# Patient Record
Sex: Male | Born: 1970 | Race: White | Hispanic: No | Marital: Married | Smoking: Never smoker
Health system: Southern US, Community
[De-identification: ages and names within clinical notes are randomized; demographics above are authoritative.]

## PROBLEM LIST (undated history)

## (undated) DIAGNOSIS — K219 Gastro-esophageal reflux disease without esophagitis: Secondary | ICD-10-CM

## (undated) DIAGNOSIS — E7800 Pure hypercholesterolemia, unspecified: Secondary | ICD-10-CM

## (undated) DIAGNOSIS — E669 Obesity, unspecified: Secondary | ICD-10-CM

## (undated) HISTORY — DX: Obesity, unspecified: E66.9

## (undated) HISTORY — DX: Pure hypercholesterolemia, unspecified: E78.00

## (undated) HISTORY — DX: Gastro-esophageal reflux disease without esophagitis: K21.9

---

## 2004-08-13 ENCOUNTER — Ambulatory Visit: Payer: Self-pay | Admitting: Internal Medicine

## 2007-07-30 ENCOUNTER — Other Ambulatory Visit: Payer: Self-pay

## 2007-07-30 ENCOUNTER — Emergency Department: Payer: Self-pay

## 2007-10-12 ENCOUNTER — Ambulatory Visit: Payer: Self-pay | Admitting: Internal Medicine

## 2009-03-06 ENCOUNTER — Emergency Department: Payer: Self-pay | Admitting: Emergency Medicine

## 2009-03-25 ENCOUNTER — Ambulatory Visit: Payer: Self-pay | Admitting: Family

## 2011-01-31 ENCOUNTER — Ambulatory Visit: Payer: Self-pay | Admitting: Cardiovascular Disease

## 2011-02-08 ENCOUNTER — Ambulatory Visit (INDEPENDENT_AMBULATORY_CARE_PROVIDER_SITE_OTHER): Payer: 59 | Admitting: Cardiovascular Disease

## 2011-02-08 ENCOUNTER — Encounter: Payer: Self-pay | Admitting: Cardiovascular Disease

## 2011-02-08 ENCOUNTER — Emergency Department: Payer: Self-pay | Admitting: Emergency Medicine

## 2011-02-08 DIAGNOSIS — E669 Obesity, unspecified: Secondary | ICD-10-CM

## 2011-02-08 DIAGNOSIS — R0602 Shortness of breath: Secondary | ICD-10-CM

## 2011-02-08 DIAGNOSIS — R079 Chest pain, unspecified: Secondary | ICD-10-CM | POA: Insufficient documentation

## 2011-02-08 DIAGNOSIS — E785 Hyperlipidemia, unspecified: Secondary | ICD-10-CM

## 2011-02-08 NOTE — Assessment & Plan Note (Signed)
His triglycerides are very elevated. We have suggested he had flaxseed or fish oil to his diet in addition to fenofibrate. Weight loss will also help. He reports being on Lipitor previously. I suspect that with additional weight loss, his LDL will be at goal less than 100.

## 2011-02-08 NOTE — Assessment & Plan Note (Signed)
We have encouraged continued exercise, careful diet management in an effort to lose weight. 

## 2011-02-08 NOTE — Progress Notes (Signed)
   Patient ID: Victor Butler, male    DOB: 1971/06/05, 40 y.o.   MRN: [id number]  HPI Comments: 40 year old gentleman, patient of Dr. Darrick Huntsman, with history of diabetes who has had recent episodes of chest dullness over the past several months who presents by referral for further evaluation.  He reports that he has not had any episodes since April 26 when he last saw his premature physician. Prior to that, he had waxing waning episodes of chest dullness that would radiate through to his back. He felt that it was secondary to indigestion. Sometimes it would happen when he was sleeping, sometimes sitting on the couch. Recently he has begun working out at [employer] and has been at least 3 times and use a treadmill machine with no symptoms of chest pain or dullness or tightness. He has been trying to lose weight.  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  He denies ever smoking. He also reports that he has no significant family history apart from his grandfather who had heart issues in his 66s  EKG shows normal sinus rhythm with rate 56 beats per minute with no significant ST or T wave changes     Review of Systems  Constitutional: Negative.   HENT: Negative.   Eyes: Negative.   Respiratory: Negative.   Cardiovascular: Negative.        12 pressure in the chest at rest  Gastrointestinal: Negative.   Musculoskeletal: Negative.   Skin: Negative.   Neurological: Negative.   Hematological: Negative.   Psychiatric/Behavioral: Negative.   All other systems reviewed and are negative.    BP 124/74  Pulse 56  Ht 5\' 10"  (1.778 m)  Wt 275 lb 1.9 oz (124.794 kg)  BMI 39.48 kg/m2   Physical Exam  Nursing note and vitals reviewed. Constitutional: He is oriented to person, place, and time. He appears well-developed and well-nourished.       obese  HENT:  Head: Normocephalic.  Nose: Nose normal.  Mouth/Throat: Oropharynx is clear and moist.    Eyes: Conjunctivae are normal. Pupils are equal, round, and reactive to light.  Neck: Normal range of motion. Neck supple. No JVD present.  Cardiovascular: Normal rate, regular rhythm, S1 normal, S2 normal, normal heart sounds and intact distal pulses.  Exam reveals no gallop and no friction rub.   No murmur heard. Pulmonary/Chest: Effort normal and breath sounds normal. No respiratory distress. He has no wheezes. He has no rales. He exhibits no tenderness.  Abdominal: Soft. Bowel sounds are normal. He exhibits no distension. There is no tenderness.  Musculoskeletal: Normal range of motion. He exhibits no edema and no tenderness.  Lymphadenopathy:    He has no cervical adenopathy.  Neurological: He is alert and oriented to person, place, and time. Coordination normal.  Skin: Skin is warm and dry. No rash noted. No erythema.  Psychiatric: He has a normal mood and affect. His behavior is normal. Judgment and thought content normal.           Assessment and Plan

## 2011-02-08 NOTE — Assessment & Plan Note (Signed)
Chest dullness comes on at rest, he has not had symptoms for several weeks. He has been at the [facility] exercising and has not had symptoms. We have suggested that we watch him for now without further testing. I suggested if he has further episodes of chest discomfort, that he contact the clinic and we would set up a treadmill stress test.

## 2011-02-18 ENCOUNTER — Emergency Department: Payer: Self-pay | Admitting: Emergency Medicine

## 2011-05-27 ENCOUNTER — Telehealth: Payer: Self-pay | Admitting: Internal Medicine

## 2011-05-27 NOTE — Telephone Encounter (Signed)
Please let Victor Butler know that he does not have to go to [facility] anymore  For his labs.  He can have his labs done here anytime before his visit  (I am not sure if [name] told him that) On 06/14/01.  He needs a fasting liopid, CMET HgbA1c and urine microalb / cr ratio

## 2011-05-27 NOTE — Telephone Encounter (Signed)
PT NEEDS LAB ORDER FOR [facility].  HIS APPOINTMENT IS 06/15/11

## 2011-06-08 ENCOUNTER — Telehealth: Payer: Self-pay | Admitting: Internal Medicine

## 2011-06-08 NOTE — Telephone Encounter (Signed)
PT CALLED SAID FAX NUMBER FOR [facility] WAS [phone]

## 2011-06-09 ENCOUNTER — Other Ambulatory Visit: Payer: Self-pay | Admitting: Internal Medicine

## 2011-06-09 DIAGNOSIS — Z79899 Other long term (current) drug therapy: Secondary | ICD-10-CM

## 2011-06-09 DIAGNOSIS — E785 Hyperlipidemia, unspecified: Secondary | ICD-10-CM

## 2011-06-09 DIAGNOSIS — E119 Type 2 diabetes mellitus without complications: Secondary | ICD-10-CM

## 2011-06-09 NOTE — Telephone Encounter (Signed)
Patient called and stated he needs labs done today and he is fasting.  Please advise on what labs you want drwn he is going to [facility].

## 2011-06-15 ENCOUNTER — Encounter: Payer: Self-pay | Admitting: Internal Medicine

## 2011-06-15 ENCOUNTER — Ambulatory Visit (INDEPENDENT_AMBULATORY_CARE_PROVIDER_SITE_OTHER): Payer: 59 | Admitting: Internal Medicine

## 2011-06-15 DIAGNOSIS — E669 Obesity, unspecified: Secondary | ICD-10-CM

## 2011-06-15 DIAGNOSIS — E119 Type 2 diabetes mellitus without complications: Secondary | ICD-10-CM

## 2011-06-15 DIAGNOSIS — E785 Hyperlipidemia, unspecified: Secondary | ICD-10-CM

## 2011-06-15 DIAGNOSIS — H6690 Otitis media, unspecified, unspecified ear: Secondary | ICD-10-CM

## 2011-06-15 DIAGNOSIS — Z79899 Other long term (current) drug therapy: Secondary | ICD-10-CM

## 2011-06-15 MED ORDER — PHENYLEPHRINE HCL 10 MG PO TABS: 10.0000 mg | ORAL_TABLET | Freq: Four times a day (QID) | ORAL | Status: DC | PRN

## 2011-06-15 MED ORDER — AZITHROMYCIN 500 MG PO TABS: 500.0000 mg | ORAL_TABLET | Freq: Every day | ORAL | Status: AC

## 2011-06-15 MED ORDER — FENOFIBRATE 145 MG PO TABS: 145.0000 mg | ORAL_TABLET | Freq: Every day | ORAL | Status: DC

## 2011-06-15 NOTE — Patient Instructions (Addendum)
Please take azithromycin 1 tablet daily for 7 days .,and Sudafed PE OTC 10 mg every 6 hours for ear pain and congestion.  continue gemfibrozil until you receive fenofibrate via mail

## 2011-06-19 ENCOUNTER — Encounter: Payer: Self-pay | Admitting: Internal Medicine

## 2011-06-19 DIAGNOSIS — E781 Pure hyperglyceridemia: Secondary | ICD-10-CM | POA: Insufficient documentation

## 2011-06-19 DIAGNOSIS — Z79899 Other long term (current) drug therapy: Secondary | ICD-10-CM | POA: Insufficient documentation

## 2011-06-19 DIAGNOSIS — H6690 Otitis media, unspecified, unspecified ear: Secondary | ICD-10-CM | POA: Insufficient documentation

## 2011-06-19 DIAGNOSIS — E7800 Pure hypercholesterolemia, unspecified: Secondary | ICD-10-CM | POA: Insufficient documentation

## 2011-06-19 DIAGNOSIS — K219 Gastro-esophageal reflux disease without esophagitis: Secondary | ICD-10-CM | POA: Insufficient documentation

## 2011-06-19 DIAGNOSIS — E669 Obesity, unspecified: Secondary | ICD-10-CM | POA: Insufficient documentation

## 2011-06-19 DIAGNOSIS — E119 Type 2 diabetes mellitus without complications: Secondary | ICD-10-CM | POA: Insufficient documentation

## 2011-06-19 NOTE — Progress Notes (Signed)
Subjective:    Patient ID: Victor Butler, male    DOB: [birth year], 40 y.o.   MRN: [id number]  HPI  40 yo white male with history of hypertriglyceridemia, diabetes mellitus and and obesity presents for follow up.  Has been taking gemfibrozil for management of triglycerides without side effects. Repeat labs recently were improved but not at goal and were reportedly better with Tricor.  He has had some weight gain,  Is not exercising regularly due to a head cold and has been waking up with frontal headache .   Past Medical History  Diagnosis Date  . Diabetes mellitus   . GERD (gastroesophageal reflux disease)   . Hypercholesteremia   . Obesity (BMI 30-39.9)    Current Outpatient Prescriptions on File Prior to Visit  Medication Sig Dispense Refill  . aspirin 81 MG EC tablet Take 81 mg by mouth daily.        . Cholecalciferol (VITAMIN D3) 400 UNITS CAPS Take 1 capsule by mouth daily.       Marland Kitchen glipiZIDE (GLUCOTROL) 10 MG tablet Take 10 mg by mouth daily.       Marland Kitchen lisinopril (PRINIVIL,ZESTRIL) 5 MG tablet Take 5 mg by mouth daily.        Marland Kitchen loratadine (CLARITIN) 10 MG tablet Take 10 mg by mouth daily.        Marland Kitchen omeprazole (PRILOSEC) 20 MG capsule Take 20 mg by mouth daily as needed.           Review of Systems  Constitutional: Negative for fever, chills, diaphoresis, activity change, appetite change, fatigue and unexpected weight change.  HENT: Negative for hearing loss, ear pain, nosebleeds, congestion, sore throat, facial swelling, rhinorrhea, sneezing, drooling, mouth sores, trouble swallowing, neck pain, neck stiffness, dental problem, voice change, postnasal drip, sinus pressure, tinnitus and ear discharge.   Eyes: Negative for photophobia, pain, discharge, redness, itching and visual disturbance.  Respiratory: Negative for apnea, cough, choking, chest tightness, shortness of breath, wheezing and stridor.   Cardiovascular: Negative for chest pain, palpitations and leg swelling.    Gastrointestinal: Negative for nausea, vomiting, abdominal pain, diarrhea, constipation, blood in stool, abdominal distention, anal bleeding and rectal pain.  Genitourinary: Negative for dysuria, urgency, frequency, hematuria, flank pain, decreased urine volume, scrotal swelling, difficulty urinating and testicular pain.  Musculoskeletal: Negative for myalgias, back pain, joint swelling, arthralgias and gait problem.  Skin: Negative for color change, rash and wound.  Neurological: Negative for dizziness, tremors, seizures, syncope, speech difficulty, weakness, light-headedness, numbness and headaches.  Psychiatric/Behavioral: Negative for suicidal ideas, hallucinations, behavioral problems, confusion, sleep disturbance, dysphoric mood, decreased concentration and agitation. The patient is not nervous/anxious.    BP 112/76  Pulse 78  Temp(Src) 97.8 F (36.6 C) (Oral)  Resp 16  Ht 5\' 10"  (1.778 m)  Wt 275 lb 8 oz (124.966 kg)  BMI 39.53 kg/m2  SpO2 95%     Objective:   Physical Exam  Constitutional: He is oriented to person, place, and time.  HENT:  Head: Normocephalic and atraumatic.  Mouth/Throat: Oropharynx is clear and moist.  Eyes: Conjunctivae and EOM are normal.  Neck: Normal range of motion. Neck supple. No JVD present. No thyromegaly present.  Cardiovascular: Normal rate, regular rhythm and normal heart sounds.   Pulmonary/Chest: Effort normal and breath sounds normal. He has no wheezes. He has no rales.  Abdominal: Soft. Bowel sounds are normal. He exhibits no mass. There is no tenderness. There is no rebound.  Musculoskeletal: Normal range of motion.  He exhibits no edema.  Neurological: He is alert and oriented to person, place, and time.  Skin: Skin is warm and dry.  Psychiatric: He has a normal mood and affect.       Assessment & Plan:

## 2011-06-19 NOTE — Assessment & Plan Note (Signed)
He has symptoms of ear fulness, headache and recent URI.  Exam suggestive of OM.  Will treat with azithromycin and oral decongestants for one week.

## 2011-06-19 NOTE — Assessment & Plan Note (Signed)
Well controlled on oral antihyperglycemics.  Recent A1c is < 7.0.  NO changes today.

## 2011-06-19 NOTE — Assessment & Plan Note (Signed)
His triglycerides are again elevated after switching to gemfibrozil but his LD has improved.  Will resume fenofibrate.  Again recommended low carbohydrate diet and exercise to improve his overall metabolic syndrome.

## 2011-07-01 ENCOUNTER — Encounter: Payer: Self-pay | Admitting: Internal Medicine

## 2011-07-08 ENCOUNTER — Other Ambulatory Visit: Payer: Self-pay | Admitting: Internal Medicine

## 2011-07-08 MED ORDER — FENOFIBRATE 145 MG PO TABS: 145.0000 mg | ORAL_TABLET | Freq: Every day | ORAL | Status: DC

## 2011-08-09 ENCOUNTER — Other Ambulatory Visit: Payer: Self-pay | Admitting: Internal Medicine

## 2011-08-10 ENCOUNTER — Encounter: Payer: Self-pay | Admitting: Internal Medicine

## 2011-08-10 ENCOUNTER — Ambulatory Visit (INDEPENDENT_AMBULATORY_CARE_PROVIDER_SITE_OTHER): Payer: 59 | Admitting: Internal Medicine

## 2011-08-10 VITALS — BP 118/72 | HR 98 | Temp 98.4°F | Wt 277.0 lb

## 2011-08-10 DIAGNOSIS — J0100 Acute maxillary sinusitis, unspecified: Secondary | ICD-10-CM

## 2011-08-10 MED ORDER — LEVOFLOXACIN 750 MG PO TABS: 750.0000 mg | ORAL_TABLET | Freq: Every day | ORAL | Status: AC

## 2011-08-10 NOTE — Progress Notes (Signed)
Subjective:    Patient ID: Victor Butler, male    DOB: 1971-09-03, 40 y.o.   MRN: [id number]  Sinusitis This is a recurrent problem. The current episode started 1 to 4 weeks ago. The problem has been gradually worsening since onset. There has been no fever. The pain is moderate. Associated symptoms include congestion, headaches, neck pain and sinus pressure. Pertinent negatives include no chills, coughing, ear pain, shortness of breath or sneezing. Past treatments include acetaminophen, oral decongestants, saline sprays and spray decongestants. The treatment provided no relief.     Outpatient Encounter Prescriptions as of 08/10/2011  Medication Sig Dispense Refill  . aspirin 81 MG EC tablet Take 81 mg by mouth daily.        . Cholecalciferol (VITAMIN D3) 400 UNITS CAPS Take 1 capsule by mouth daily.       Marland Kitchen gemfibrozil (LOPID) 600 MG tablet Take 600 mg by mouth daily.        Marland Kitchen glipiZIDE (GLUCOTROL) 10 MG tablet Take 10 mg by mouth daily.       Marland Kitchen lisinopril (PRINIVIL,ZESTRIL) 5 MG tablet Take 5 mg by mouth daily.        Marland Kitchen loratadine (CLARITIN) 10 MG tablet Take 10 mg by mouth daily.        . metformin (FORTAMET) 500 MG (OSM) 24 hr tablet Take 500 mg by mouth daily with breakfast.        . omeprazole (PRILOSEC) 20 MG capsule Take 20 mg by mouth daily as needed.       . phenylephrine (SUDAFED PE) 10 MG TABS Take 1 tablet (10 mg total) by mouth every 6 (six) hours as needed (congestion ).  30 tablet  1  . fenofibrate (TRICOR) 145 MG tablet Take 1 tablet (145 mg total) by mouth daily. Refill as generic medication only.  90 tablet  3    Review of Systems  Constitutional: Negative for fever, chills, activity change, appetite change, fatigue and unexpected weight change.  HENT: Positive for congestion, neck pain, postnasal drip and sinus pressure. Negative for hearing loss, ear pain, nosebleeds, rhinorrhea, sneezing, neck stiffness, voice change, tinnitus and ear discharge.   Eyes: Negative for  photophobia, discharge, redness, itching and visual disturbance.  Respiratory: Negative for cough, chest tightness, shortness of breath and wheezing.   Cardiovascular: Negative for chest pain, palpitations and leg swelling.  Gastrointestinal: Negative for abdominal pain.  Musculoskeletal: Negative for myalgias.  Skin: Negative for color change and rash.  Neurological: Positive for headaches. Negative for dizziness and facial asymmetry.   BP 118/72  Pulse 98  Temp(Src) 98.4 F (36.9 C) (Oral)  Wt 277 lb (125.646 kg)  SpO2 95%     Objective:   Physical Exam  Constitutional: He is oriented to person, place, and time. He appears well-developed and well-nourished. No distress.  HENT:  Head: Normocephalic and atraumatic.  Right Ear: External ear normal. Tympanic membrane is erythematous. A middle ear effusion is present.  Left Ear: External ear normal. Tympanic membrane is erythematous. A middle ear effusion is present.  Nose: Nose normal.  Mouth/Throat: Oropharynx is clear and moist. No oropharyngeal exudate.  Eyes: Conjunctivae and EOM are normal. Pupils are equal, round, and reactive to light. Right eye exhibits no discharge. Left eye exhibits no discharge. No scleral icterus.  Neck: Normal range of motion. Neck supple. No tracheal deviation present. No thyromegaly present.  Cardiovascular: Normal rate, regular rhythm and normal heart sounds.  Exam reveals no gallop and no friction rub.  No murmur heard. Pulmonary/Chest: Effort normal and breath sounds normal. No respiratory distress. He has no wheezes. He has no rales. He exhibits no tenderness.  Musculoskeletal: Normal range of motion. He exhibits no edema.  Lymphadenopathy:    He has no cervical adenopathy.  Neurological: He is alert and oriented to person, place, and time. No cranial nerve deficit. Coordination normal.  Skin: Skin is warm and dry. No rash noted. He is not diaphoretic. No erythema. No pallor.  Psychiatric: He has  a normal mood and affect. His behavior is normal. Judgment and thought content normal.          Assessment & Plan:  1. Left Maxillary Sinusitis - Will treat with Levaquin. Continue sudafed, ibuprofen as needed. Pt will call if symptoms not improving next 48hr.

## 2011-10-09 ENCOUNTER — Telehealth: Payer: Self-pay | Admitting: Internal Medicine

## 2011-10-09 NOTE — Telephone Encounter (Signed)
His triglycerides are 503, fasting glucose was 205,  hgba1c was 7.8 and there was protein in his urine.  We will address all of these with his next visit

## 2011-10-10 NOTE — Telephone Encounter (Signed)
Left message asking patient to return my call.

## 2011-10-11 ENCOUNTER — Ambulatory Visit (INDEPENDENT_AMBULATORY_CARE_PROVIDER_SITE_OTHER): Payer: 59 | Admitting: Internal Medicine

## 2011-10-11 ENCOUNTER — Encounter: Payer: Self-pay | Admitting: Internal Medicine

## 2011-10-11 DIAGNOSIS — E785 Hyperlipidemia, unspecified: Secondary | ICD-10-CM

## 2011-10-11 DIAGNOSIS — E669 Obesity, unspecified: Secondary | ICD-10-CM

## 2011-10-11 DIAGNOSIS — E119 Type 2 diabetes mellitus without complications: Secondary | ICD-10-CM

## 2011-10-11 DIAGNOSIS — E1129 Type 2 diabetes mellitus with other diabetic kidney complication: Secondary | ICD-10-CM

## 2011-10-11 MED ORDER — METFORMIN HCL ER (OSM) 500 MG PO TB24: 500.0000 mg | ORAL_TABLET | Freq: Every day | ORAL | Status: DC

## 2011-10-11 MED ORDER — ALPRAZOLAM 0.5 MG PO TBDP: 0.5000 mg | ORAL_TABLET | Freq: Every day | ORAL | Status: AC | PRN

## 2011-10-11 NOTE — Patient Instructions (Signed)
You need to exercise for a minimum of 30 minutes daily 5 days per week.  You need to be breathing hard  To get the aerobic benefit.   We are going to try alprazolam for the anxiety   i want you to lose 12 lbs by the next visit.   Use the EAS Carb Control protein shake for breakfast followed by an Atkins chocolate lover's variety protein bar mid morning,

## 2011-10-11 NOTE — Progress Notes (Signed)
Subjective:    Patient ID: Victor Butler, male    DOB: 12-19-1970, 41 y.o.   MRN: [id number]  HPI  Victor Butler IS A 41 yr old white male with  DM , hyperlipidemia, HTN and obesity who presents for 3 month followup with a  multitude of complaints.  He continues to have episodes of chest pain which occur while sitting at his desk. He had a cardiology evla after last visit for same complaint and had a normal ECHO so no myoview was done by Dr. Mariah Milling since hee never has the chest pain with treadmill use even after 2 miles.  He describes it is a dull ache.  His second complaint is chronic sinus congestion, occasional tooth pain, with priro normal ENT evaluation including  A normal sinus CT.  His third complaint is increased stress and anxiety caused by recent move to a new house that has been  Requiring additional financial investments.  He is not exercising daily or evern 3 times a week despite having a gym membership and ample space in his new home o use equip,ment he already owns.       Review of Systems  Constitutional: Positive for unexpected weight change. Negative for fever, chills, diaphoresis, activity change, appetite change and fatigue.  HENT: Positive for congestion, rhinorrhea and postnasal drip. Negative for hearing loss, ear pain, nosebleeds, sore throat, facial swelling, sneezing, drooling, mouth sores, trouble swallowing, neck pain, neck stiffness, dental problem, voice change, sinus pressure, tinnitus and ear discharge.   Eyes: Negative for photophobia, pain, discharge, redness, itching and visual disturbance.  Respiratory: Negative for apnea, cough, choking, chest tightness, shortness of breath, wheezing and stridor.   Cardiovascular: Negative for chest pain, palpitations and leg swelling.  Gastrointestinal: Negative for nausea, vomiting, abdominal pain, diarrhea, constipation, blood in stool, abdominal distention, anal bleeding and rectal pain.  Genitourinary: Negative for dysuria,  urgency, frequency, hematuria, flank pain, decreased urine volume, scrotal swelling, difficulty urinating and testicular pain.  Musculoskeletal: Negative for myalgias, back pain, joint swelling, arthralgias and gait problem.  Skin: Negative for color change, rash and wound.  Neurological: Negative for dizziness, tremors, seizures, syncope, speech difficulty, weakness, light-headedness, numbness and headaches.  Psychiatric/Behavioral: Negative for suicidal ideas, hallucinations, behavioral problems, confusion, sleep disturbance, dysphoric mood, decreased concentration and agitation. The patient is nervous/anxious.   BP 108/84  Pulse 78  Temp(Src) 98.2 F (36.8 C) (Oral)  Wt 276 lb (125.193 kg)  SpO2 97%    Objective:   Physical Exam  Constitutional: He is oriented to person, place, and time.  HENT:  Head: Normocephalic and atraumatic.  Mouth/Throat: Oropharynx is clear and moist.  Eyes: Conjunctivae and EOM are normal.  Neck: Normal range of motion. Neck supple. No JVD present. No thyromegaly present.  Cardiovascular: Normal rate, regular rhythm and normal heart sounds.   Pulmonary/Chest: Effort normal and breath sounds normal. He has no wheezes. He has no rales.  Abdominal: Soft. Bowel sounds are normal. He exhibits no mass. There is no tenderness. There is no rebound.  Musculoskeletal: Normal range of motion. He exhibits no edema.  Neurological: He is alert and oriented to person, place, and time.  Skin: Skin is warm and dry.  Psychiatric: He has a normal mood and affect.      Assessment & Plan:   Obesity (BMI 30-39.9) I have strongly recommended concerted effort to reduce weight through starch restricted diet and aerobic exercise 5 days per week.  Goal is 4 lbs/month.   Other and  unspecified hyperlipidemia His triglcerides are 500 despite use of fenofibrate. Strongly recommended compliance with medication andrestricting starches to one or 2 servings daily and exercise.  Repeat in  3 months   Type II or unspecified type diabetes mellitus with renal manifestations, not stated as uncontrolled With slight loss of control noted,  Recommended diet and exercise and repeat labs in 3 months at which time medication will be added if not at goal of 7.0 (currently 7.8) with proteinuria noted on UA>      Updated Medication List Outpatient Encounter Prescriptions as of 10/11/2011  Medication Sig Dispense Refill  . aspirin 81 MG EC tablet Take 81 mg by mouth daily.        . Cholecalciferol (VITAMIN D3) 400 UNITS CAPS Take 1 capsule by mouth daily.       . fenofibrate (TRICOR) 145 MG tablet Take 1 tablet (145 mg total) by mouth daily. Refill as generic medication only.  90 tablet  3  . glipiZIDE (GLUCOTROL) 10 MG tablet Take 10 mg by mouth daily.       Marland Kitchen lisinopril (PRINIVIL,ZESTRIL) 5 MG tablet TAKE 1 TABLET BY MOUTH DAILY  90 tablet  0  . loratadine (CLARITIN) 10 MG tablet Take 10 mg by mouth daily.        . metformin (FORTAMET) 500 MG (OSM) 24 hr tablet Take 1 tablet (500 mg total) by mouth daily with breakfast.  90 tablet  2  . omeprazole (PRILOSEC) 20 MG capsule Take 20 mg by mouth daily as needed.       . phenylephrine (SUDAFED PE) 10 MG TABS Take 1 tablet (10 mg total) by mouth every 6 (six) hours as needed (congestion ).  30 tablet  1  . DISCONTD: metformin (FORTAMET) 500 MG (OSM) 24 hr tablet Take 500 mg by mouth daily with breakfast.        . DISCONTD: metformin (FORTAMET) 500 MG (OSM) 24 hr tablet Take 1 tablet (500 mg total) by mouth daily with breakfast.  30 tablet  1  . ALPRAZolam (NIRAVAM) 0.5 MG dissolvable tablet Take 1 tablet (0.5 mg total) by mouth daily as needed for anxiety.  30 tablet  1  . DISCONTD: gemfibrozil (LOPID) 600 MG tablet Take 600 mg by mouth daily.

## 2011-10-12 ENCOUNTER — Encounter: Payer: Self-pay | Admitting: Internal Medicine

## 2011-10-12 DIAGNOSIS — E1165 Type 2 diabetes mellitus with hyperglycemia: Secondary | ICD-10-CM | POA: Insufficient documentation

## 2011-10-12 DIAGNOSIS — IMO0002 Reserved for concepts with insufficient information to code with codable children: Secondary | ICD-10-CM | POA: Insufficient documentation

## 2011-10-12 NOTE — Assessment & Plan Note (Signed)
With slight loss of control noted,  Recommended diet and exercise and repeat labs in 3 months at which time medication will be added if not at goal of 7.0 (currently 7.8) with proteinuria noted on UA>

## 2011-10-12 NOTE — Assessment & Plan Note (Deleted)
Loss of control noted,  Recommended diet and exercise and repeat labs in 3 months at which time medication will be added if not at goal of 7.0

## 2011-10-12 NOTE — Assessment & Plan Note (Signed)
His triglcerides are 500 despite use of fenofibrate. Strongly recommended compliance with medication andrestricting starches to one or 2 servings daily and exercise.  Repeat in 3 months

## 2011-10-12 NOTE — Assessment & Plan Note (Signed)
I have strongly recommended concerted effort to reduce weight through starch restricted diet and aerobic exercise 5 days per week.  Goal is 4 lbs/month.

## 2011-11-01 ENCOUNTER — Other Ambulatory Visit: Payer: Self-pay | Admitting: *Deleted

## 2011-11-01 MED ORDER — METFORMIN HCL 500 MG PO TABS: 500.0000 mg | ORAL_TABLET | Freq: Two times a day (BID) | ORAL | Status: DC

## 2011-11-10 ENCOUNTER — Telehealth: Payer: Self-pay | Admitting: Internal Medicine

## 2011-11-10 MED ORDER — GLUCOSE BLOOD VI STRP: ORAL_STRIP | Status: DC

## 2011-11-10 NOTE — Telephone Encounter (Signed)
Rx has been done. 

## 2011-11-14 ENCOUNTER — Other Ambulatory Visit: Payer: Self-pay | Admitting: *Deleted

## 2011-11-14 MED ORDER — LISINOPRIL 5 MG PO TABS: ORAL_TABLET | ORAL | Status: DC

## 2011-11-21 ENCOUNTER — Telehealth: Payer: Self-pay | Admitting: Internal Medicine

## 2011-11-21 NOTE — Telephone Encounter (Signed)
[facility] Triage Call Report Triage Record Num: [id number] Operator: Fernand Parkins Patient Name: Victor Butler Call Date & Time: 11/21/2011 10:03:40AM Patient Phone: [phone] PCP: Duncan Dull Patient Gender: Male PCP Fax : [phone] Patient DOB: 1971/05/04 Practice Name: [facility] Reason for Call: Caller: Jahvon/Patient; PCP: Duncan Dull; CB#: [phone]; ; ; Call regarding Cold Sx, Chest Tightness and Breathing Difficulty THE PATIENT REFUSED 911; Afebrile. Earnie/pt calling regarding a dull ache in chest and back area with shortness of breath - onset 11/17/2011. Chest discomfort and shortness of breath are currently resolved. Pt feels like it's r/t the chest cold that he currently has. S/w Robin in pcp office, appt 11/22/2011 @ 8:15am with MD Duncan Dull. All emergent sxs r/o with exception of see provider within 24 hrs - per Chest Pain guidelines. Care advice given. Protocol(s) Used: Chest Pain Recommended Outcome per Protocol: See Provider within 24 hours Reason for Outcome: One or more occurrences of unexplained pain in shoulders, neck, jaw, in one or both arms, stomach or back lasting more than a few minutes that has not been evaluated by a healthcare provider and has risk factors for cardiovascular disease. Care Advice: ~ Call provider if symptoms worsen or new symptoms develop. Consult with your healthcare provider to identify your risks and follow recommendations to reduce any risks identified. ~ ~ IMMEDIATE ACTION ~ CAUTIONS ~ List, or take, all current prescription(s), nonprescription or alternative medication(s) to provider for evaluation. CALL EMS 911 if chest pain/discomfort lasts five minutes or more; may or may not also have pain/discomfort in one or both arms, the back, neck, jaw or stomach and/or shortness of breath, sweating, nausea or lightheadedness. ~ "Know your numbers" to help manage risk for cardiovascular disease.  Regularly monitor your blood pressure, cholesterol, blood glucose and body mass index and take steps to reduce your risks. ~ 11/21/2011 10:25:37AM Page 1 of 1 [email or web address]

## 2011-11-22 ENCOUNTER — Ambulatory Visit (INDEPENDENT_AMBULATORY_CARE_PROVIDER_SITE_OTHER): Payer: 59 | Admitting: Internal Medicine

## 2011-11-22 ENCOUNTER — Encounter: Payer: Self-pay | Admitting: Internal Medicine

## 2011-11-22 ENCOUNTER — Ambulatory Visit: Payer: Self-pay | Admitting: Internal Medicine

## 2011-11-22 VITALS — BP 100/70 | HR 92 | Temp 98.6°F | Wt 260.0 lb

## 2011-11-22 DIAGNOSIS — R079 Chest pain, unspecified: Secondary | ICD-10-CM

## 2011-11-22 DIAGNOSIS — E1129 Type 2 diabetes mellitus with other diabetic kidney complication: Secondary | ICD-10-CM

## 2011-11-22 DIAGNOSIS — N058 Unspecified nephritic syndrome with other morphologic changes: Secondary | ICD-10-CM

## 2011-11-22 LAB — COMPREHENSIVE METABOLIC PANEL
ALT: 29 U/L (ref 0–53)
AST: 29 U/L (ref 0–37)
Albumin: 4.4 g/dL (ref 3.5–5.2)
Alkaline Phosphatase: 63 U/L (ref 39–117)
Glucose, Bld: 122 mg/dL — ABNORMAL HIGH (ref 70–99)
Potassium: 4.6 mEq/L (ref 3.5–5.1)
Sodium: 142 mEq/L (ref 135–145)
Total Bilirubin: 0.3 mg/dL (ref 0.3–1.2)
Total Protein: 7.4 g/dL (ref 6.0–8.3)

## 2011-11-22 LAB — LIPASE: Lipase: 27 U/L (ref 11.0–59.0)

## 2011-11-22 NOTE — Telephone Encounter (Signed)
Pt was seen in office this morning

## 2011-11-22 NOTE — Progress Notes (Signed)
Subjective:    Patient ID: Victor Butler, male    DOB: 09-Dec-1970, 41 y.o.   MRN: [id number]  HPI  Victor Butler is a 41 yr old white male with history of Type 2 DM who presents with a dull ache in chest which started Friday morning (5 days ago)  The pain is located more between shoulder blades than in the anterior chest.  Comes and goes,  More obvious at times than others,  Dull /soreness in quality.  No pain with exercise on the treadmill.  Denies nausea ,  Has some shortness of breath at rest, feels like he needs to take a deep  Breath.  No history of cough or wheezing .  Has lost 16 lbs in the past month since starting the Atkins diet and has been having hypoglycemic episodes on metformin and glipizide.    Past Medical History  Diagnosis Date  . Diabetes mellitus   . GERD (gastroesophageal reflux disease)   . Hypercholesteremia   . Obesity (BMI 30-39.9)    Current Outpatient Prescriptions on File Prior to Visit  Medication Sig Dispense Refill  . aspirin 81 MG EC tablet Take 81 mg by mouth daily.        . Cholecalciferol (VITAMIN D3) 400 UNITS CAPS Take 1 capsule by mouth daily.       . fenofibrate (TRICOR) 145 MG tablet Take 1 tablet (145 mg total) by mouth daily. Refill as generic medication only.  90 tablet  3  . glucose blood (CHOICE DM FORA G20 TEST STRIPS) test strip Use as instructed  300 each  1  . lisinopril (PRINIVIL,ZESTRIL) 5 MG tablet Take one by mouth daily.  90 tablet  3  . loratadine (CLARITIN) 10 MG tablet Take 10 mg by mouth daily.        . metFORMIN (GLUCOPHAGE) 500 MG tablet Take 1 tablet (500 mg total) by mouth 2 (two) times daily with a meal.  180 tablet  3  . omeprazole (PRILOSEC) 20 MG capsule Take 20 mg by mouth daily as needed.       . phenylephrine (SUDAFED PE) 10 MG TABS Take 1 tablet (10 mg total) by mouth every 6 (six) hours as needed (congestion ).  30 tablet  1   Review of Systems  Constitutional: Negative for fever, chills, diaphoresis, activity change,  appetite change, fatigue and unexpected weight change.  HENT: Negative for hearing loss, ear pain, nosebleeds, congestion, sore throat, facial swelling, rhinorrhea, sneezing, drooling, mouth sores, trouble swallowing, neck pain, neck stiffness, dental problem, voice change, postnasal drip, sinus pressure, tinnitus and ear discharge.   Eyes: Negative for photophobia, pain, discharge, redness, itching and visual disturbance.  Respiratory: Positive for chest tightness. Negative for apnea, cough, choking, shortness of breath, wheezing and stridor.   Cardiovascular: Negative for chest pain, palpitations and leg swelling.  Gastrointestinal: Negative for nausea, vomiting, abdominal pain, diarrhea, constipation, blood in stool, abdominal distention, anal bleeding and rectal pain.  Genitourinary: Negative for dysuria, urgency, frequency, hematuria, flank pain, decreased urine volume, scrotal swelling, difficulty urinating and testicular pain.  Musculoskeletal: Positive for back pain. Negative for myalgias, joint swelling, arthralgias and gait problem.  Skin: Negative for color change, rash and wound.  Neurological: Negative for dizziness, tremors, seizures, syncope, speech difficulty, weakness, light-headedness, numbness and headaches.  Psychiatric/Behavioral: Negative for suicidal ideas, hallucinations, behavioral problems, confusion, sleep disturbance, dysphoric mood, decreased concentration and agitation. The patient is not nervous/anxious.        Objective:  Physical Exam  Constitutional: He is oriented to person, place, and time.  HENT:  Head: Normocephalic and atraumatic.  Mouth/Throat: Oropharynx is clear and moist.  Eyes: Conjunctivae and EOM are normal.  Neck: Normal range of motion. Neck supple. No JVD present. No thyromegaly present.  Cardiovascular: Normal rate, regular rhythm and normal heart sounds.   Pulmonary/Chest: Effort normal and breath sounds normal. He has no wheezes. He has no  rales.  Abdominal: Soft. Bowel sounds are normal. He exhibits no mass. There is no tenderness. There is no rebound.  Musculoskeletal: Normal range of motion. He exhibits no edema.  Neurological: He is alert and oriented to person, place, and time.  Skin: Skin is warm and dry.  Psychiatric: He has a normal mood and affect.      Assessment & Plan:   Chest pain ddx includes GB disease, pancreatic disease,  atypical angina and muscle strain.   EKG, hepatic enzymes and lipase are normal, and cardiac enzymes and CXR are pending .    Type II or unspecified type diabetes mellitus with renal manifestations, not stated as uncontrolled He has lost 15 lbs on the Atkins diet and has had recurrent hypoglycemia.  We are stopping his glipizide.     Updated Medication List Outpatient Encounter Prescriptions as of 11/22/2011  Medication Sig Dispense Refill  . aspirin 81 MG EC tablet Take 81 mg by mouth daily.        . Cholecalciferol (VITAMIN D3) 400 UNITS CAPS Take 1 capsule by mouth daily.       . fenofibrate (TRICOR) 145 MG tablet Take 1 tablet (145 mg total) by mouth daily. Refill as generic medication only.  90 tablet  3  . glucose blood (CHOICE DM FORA G20 TEST STRIPS) test strip Use as instructed  300 each  1  . lisinopril (PRINIVIL,ZESTRIL) 5 MG tablet Take one by mouth daily.  90 tablet  3  . loratadine (CLARITIN) 10 MG tablet Take 10 mg by mouth daily.        . metFORMIN (GLUCOPHAGE) 500 MG tablet Take 1 tablet (500 mg total) by mouth 2 (two) times daily with a meal.  180 tablet  3  . omeprazole (PRILOSEC) 20 MG capsule Take 20 mg by mouth daily as needed.       . phenylephrine (SUDAFED PE) 10 MG TABS Take 1 tablet (10 mg total) by mouth every 6 (six) hours as needed (congestion ).  30 tablet  1  . DISCONTD: glipiZIDE (GLUCOTROL) 10 MG tablet Take 10 mg by mouth daily.

## 2011-11-22 NOTE — Assessment & Plan Note (Addendum)
ddx includes GB disease, pancreatic disease,  atypical angina and muscle strain.   EKG, hepatic enzymes and lipase are normal, and cardiac enzymes and CXR are pending .

## 2011-11-22 NOTE — Assessment & Plan Note (Signed)
He has lost 15 lbs on the Atkins diet and has had recurrent hypoglycemia.  We are stopping his glipizide.

## 2011-11-23 ENCOUNTER — Telehealth: Payer: Self-pay | Admitting: Internal Medicine

## 2011-11-23 LAB — CK TOTAL AND CKMB (NOT AT ARMC)
CK, MB: 2.9 ng/mL (ref 0.3–4.0)
Relative Index: 1.6 (ref 0.0–2.5)

## 2011-11-23 NOTE — Telephone Encounter (Signed)
His chest x ray was normal.  The etiology of  his recent chest/back discomfort is still unclear.  If it persists or changes in some way, I am happy to  continue workup with either an abdominal ultrasound or CT scan  To image the GB and pancreas which can both cause "referred" pain to the shoulder blades.

## 2011-11-24 NOTE — Telephone Encounter (Signed)
Left message asking patient to call back

## 2011-12-01 ENCOUNTER — Encounter: Payer: Self-pay | Admitting: Internal Medicine

## 2011-12-01 NOTE — Telephone Encounter (Signed)
Patient notified. He says that he is not having any pain at all right now and hasn't for several days. He was advised to call back if pain come back.

## 2011-12-27 ENCOUNTER — Telehealth: Payer: Self-pay | Admitting: Internal Medicine

## 2011-12-27 DIAGNOSIS — E785 Hyperlipidemia, unspecified: Secondary | ICD-10-CM

## 2011-12-27 NOTE — Telephone Encounter (Signed)
Labs order placed as future with [facility] as resulting agency.  Do we need to print to send to [facility]?

## 2011-12-27 NOTE — Telephone Encounter (Signed)
Patient called and needs a lab order for his 3 month check up next week sent to [facility].  He also stated his work needs a urine micro/albumin.  Please advise.  Fax: [phone]

## 2011-12-27 NOTE — Telephone Encounter (Signed)
Yes, I will fax them to number provided.   Orders faxed.

## 2012-01-04 ENCOUNTER — Telehealth: Payer: Self-pay | Admitting: Internal Medicine

## 2012-01-04 NOTE — Telephone Encounter (Signed)
He has brought his triglycerides down from over 500 to 181 and his hgba1c from 7.8 to 6.7 with diet and exercise. And there is no protein in hir urine. This is excellent! Keep doing what he is doing.

## 2012-01-05 NOTE — Telephone Encounter (Signed)
Patient notified of results.

## 2012-01-09 ENCOUNTER — Encounter: Payer: Self-pay | Admitting: Internal Medicine

## 2012-01-09 ENCOUNTER — Ambulatory Visit (INDEPENDENT_AMBULATORY_CARE_PROVIDER_SITE_OTHER): Payer: 59 | Admitting: Internal Medicine

## 2012-01-09 ENCOUNTER — Telehealth: Payer: Self-pay | Admitting: Internal Medicine

## 2012-01-09 DIAGNOSIS — E291 Testicular hypofunction: Secondary | ICD-10-CM

## 2012-01-09 DIAGNOSIS — N529 Male erectile dysfunction, unspecified: Secondary | ICD-10-CM

## 2012-01-09 DIAGNOSIS — E1129 Type 2 diabetes mellitus with other diabetic kidney complication: Secondary | ICD-10-CM

## 2012-01-09 DIAGNOSIS — E669 Obesity, unspecified: Secondary | ICD-10-CM

## 2012-01-09 DIAGNOSIS — N058 Unspecified nephritic syndrome with other morphologic changes: Secondary | ICD-10-CM

## 2012-01-09 DIAGNOSIS — E785 Hyperlipidemia, unspecified: Secondary | ICD-10-CM

## 2012-01-09 MED ORDER — SILDENAFIL CITRATE 50 MG PO TABS: 50.0000 mg | ORAL_TABLET | Freq: Every day | ORAL | Status: DC | PRN

## 2012-01-09 MED ORDER — ACCU-CHEK MULTICLIX LANCETS MISC: Status: AC

## 2012-01-09 NOTE — Telephone Encounter (Signed)
Victor Butler,  Can you order the following labs for Victor Butler via [facility]?   Testosterone, free and total  To be done ASAP  FOLLOWED BY:   Occurring in 3 months :  Hgba1c, CMEt, fasting lpids, adn  Urine microalb/Cr ratio   Thank you

## 2012-01-09 NOTE — Progress Notes (Signed)
Patient ID: Victor Butler, male   DOB: 1971-06-12, 41 y.o.   MRN: [id number]  Patient Active Problem List  Diagnoses  . Chest pain  . GERD (gastroesophageal reflux disease)  . Obesity (BMI 30-39.9)  . Other and unspecified hyperlipidemia  . Encounter for long-term (current) use of other medications  . Otitis media  . Type II or unspecified type diabetes mellitus with renal manifestations, not stated as uncontrolled    Subjective:  CC:   Chief Complaint  Patient presents with  . Follow-up    HPI:   Victor Butler is a 41 y.o. male who presents for follow up on diabetes, obesity and hyperlipidemia.  He has been a bit nonadherent to diet for the past month after making significant progress initially.  Cittes  spring break and kid's vacation schedule as contributors.  Not exercising regularly either.  He is not checking sugars lately since he has not had any more hypoglycemic events.  No new issues.  Taking glipizide , fenofibrate, other meds including aspirin as directed.  His weight has plateaued after a 16 lb wt loss .    Past Medical History  Diagnosis Date  . Diabetes mellitus   . GERD (gastroesophageal reflux disease)   . Hypercholesteremia   . Obesity (BMI 30-39.9)     History reviewed. No pertinent past surgical history.       The following portions of the patient's history were reviewed and updated as appropriate: Allergies, current medications, and problem list.    Review of Systems:   12 Pt  review of systems was negative except those addressed in the HPI,     History   Social History  . Marital Status: Married    Spouse Name: N/A    Number of Children: N/A  . Years of Education: N/A   Occupational History  . Not on file.   Social History Main Topics  . Smoking status: Never Smoker   . Smokeless tobacco: Never Used  . Alcohol Use: No  . Drug Use: No  . Sexually Active: Not on file   Other Topics Concern  . Not on file   Social History  Narrative  . No narrative on file    Objective:  BP 112/70  Pulse 80  Temp(Src) 98.3 F (36.8 C) (Oral)  Resp 16  Wt 260 lb 4 oz (118.049 kg)  SpO2 97%  General appearance: alert, cooperative and appears stated age Ears: normal TM's and external ear canals both ears Throat: lips, mucosa, and tongue normal; teeth and gums normal Neck: no adenopathy, no carotid bruit, supple, symmetrical, trachea midline and thyroid not enlarged, symmetric, no tenderness/mass/nodules Back: symmetric, no curvature. ROM normal. No CVA tenderness. Lungs: clear to auscultation bilaterally Heart: regular rate and rhythm, S1, S2 normal, no murmur, click, rub or gallop Abdomen: soft, non-tender; bowel sounds normal; no masses,  no organomegaly Pulses: 2+ and symmetric Skin: Skin color, texture, turgor normal. No rashes or lesions Lymph nodes: Cervical, supraclavicular, and axillary nodes normal.  Assessment and Plan:  Type II or unspecified type diabetes mellitus with renal manifestations, not stated as uncontrolled Significant imprement with carb restricted, low glycemin index diet.  a1c is now 6.7 and he has no proteinuria.  No changes today,      Other and unspecified hyperlipidemia imporved with fenofibrate and low GI diet,  LDL is 67 and trigs decreased from 500 to 181.  HDL is quite low, though at 29.    Obesity (BMI 30-39.9)  I have addressed  BMI and recommended resuming the  low glycemic index diet utilizing smaller more frequent meals to increase metabolism.  I have also recommended that patient start exercising with a goal of 30 minutes of aerobic exercise a minimum of 5 days per week.       Updated Medication List Outpatient Encounter Prescriptions as of 01/09/2012  Medication Sig Dispense Refill  . aspirin 81 MG EC tablet Take 81 mg by mouth daily.        . Cholecalciferol (VITAMIN D3) 400 UNITS CAPS Take 1,000 Units by mouth daily.       . fenofibrate (TRICOR) 145 MG tablet Take 1  tablet (145 mg total) by mouth daily. Refill as generic medication only.  90 tablet  3  . glucose blood (CHOICE DM FORA G20 TEST STRIPS) test strip Use as instructed  300 each  1  . lisinopril (PRINIVIL,ZESTRIL) 5 MG tablet Take one by mouth daily.  90 tablet  3  . loratadine (CLARITIN) 10 MG tablet Take 10 mg by mouth daily.        . metFORMIN (GLUCOPHAGE) 500 MG tablet Take 500 mg by mouth daily with breakfast.      . omeprazole (PRILOSEC) 20 MG capsule Take 20 mg by mouth daily as needed.       . phenylephrine (SUDAFED PE) 10 MG TABS Take 1 tablet (10 mg total) by mouth every 6 (six) hours as needed (congestion ).  30 tablet  1  . DISCONTD: metFORMIN (GLUCOPHAGE) 500 MG tablet Take 1 tablet (500 mg total) by mouth 2 (two) times daily with a meal.  180 tablet  3  . Lancets (ACCU-CHEK MULTICLIX) lancets Use as instructed  100 each  12  . sildenafil (VIAGRA) 50 MG tablet Take 1 tablet (50 mg total) by mouth daily as needed for erectile dysfunction.  10 tablet  0     No orders of the defined types were placed in this encounter.    No Follow-up on file.

## 2012-01-10 ENCOUNTER — Encounter: Payer: Self-pay | Admitting: Internal Medicine

## 2012-01-10 NOTE — Assessment & Plan Note (Signed)
imporved with fenofibrate and low GI diet,  LDL is 67 and trigs decreased from 500 to 181.  HDL is quite low, though at 29.

## 2012-01-10 NOTE — Assessment & Plan Note (Signed)
I have addressed  BMI and recommended resuming the low glycemic index diet utilizing smaller more frequent meals to increase metabolism.  I have also recommended that patient start exercising with a goal of 30 minutes of aerobic exercise a minimum of 5 days per week.  

## 2012-01-10 NOTE — Assessment & Plan Note (Signed)
Significant imprement with carb restricted, low glycemin index diet.  a1c is now 6.7 and he has no proteinuria.  No changes today,

## 2012-01-11 NOTE — Telephone Encounter (Signed)
Orders entered

## 2012-01-17 ENCOUNTER — Encounter: Payer: Self-pay | Admitting: Internal Medicine

## 2012-03-21 ENCOUNTER — Emergency Department: Payer: Self-pay

## 2012-03-21 LAB — CBC
HCT: 40.7 % (ref 40.0–52.0)
MCHC: 34.1 g/dL (ref 32.0–36.0)
Platelet: 277 10*3/uL (ref 150–440)
RBC: 4.61 10*6/uL (ref 4.40–5.90)

## 2012-03-22 LAB — BASIC METABOLIC PANEL
Chloride: 109 mmol/L — ABNORMAL HIGH (ref 98–107)
Co2: 25 mmol/L (ref 21–32)
Creatinine: 1.3 mg/dL (ref 0.60–1.30)
EGFR (Non-African Amer.): >60
Glucose: 139 mg/dL — ABNORMAL HIGH (ref 65–99)
Sodium: 141 mmol/L (ref 136–145)

## 2012-03-22 LAB — CK TOTAL AND CKMB (NOT AT ARMC): CK-MB: 1.8 ng/mL (ref 0.5–3.6)

## 2012-03-22 LAB — TROPONIN I
Troponin-I: <0.02 ng/mL
Troponin-I: <0.02 ng/mL

## 2012-05-07 ENCOUNTER — Telehealth: Payer: Self-pay | Admitting: *Deleted

## 2012-05-07 DIAGNOSIS — E785 Hyperlipidemia, unspecified: Secondary | ICD-10-CM

## 2012-05-07 DIAGNOSIS — E119 Type 2 diabetes mellitus without complications: Secondary | ICD-10-CM

## 2012-05-07 DIAGNOSIS — Z79899 Other long term (current) drug therapy: Secondary | ICD-10-CM

## 2012-05-07 NOTE — Telephone Encounter (Signed)
Patient is asking for an order for lab work faxed to [facility]. Fax number is [phone]

## 2012-05-07 NOTE — Telephone Encounter (Signed)
Victor Butler,  I have entered Mr. hutchinson orders as [facility] , [facility] as [facility].  Can you print them out and fax them to be sure they have them when he goes?

## 2012-05-07 NOTE — Telephone Encounter (Signed)
Yes labs printed and faxed.

## 2012-05-09 LAB — COMPREHENSIVE METABOLIC PANEL
ALT: 23 IU/L (ref 0–44)
AST: 19 IU/L (ref 0–40)
Albumin/Globulin Ratio: 1.8 (ref 1.1–2.5)
Albumin: 4.7 g/dL (ref 3.5–5.5)
Alkaline Phosphatase: 63 IU/L (ref 44–102)
BUN/Creatinine Ratio: 21 — ABNORMAL HIGH (ref 9–20)
BUN: 20 mg/dL (ref 6–24)
CO2: 21 mmol/L (ref 19–28)
Calcium: 9.5 mg/dL (ref 8.7–10.2)
Chloride: 102 mmol/L (ref 97–108)
Creatinine, Ser: 0.96 mg/dL (ref 0.76–1.27)
GFR calc Af Amer: 114 mL/min/{1.73_m2} (ref >59)
GFR calc non Af Amer: 98 mL/min/{1.73_m2} (ref >59)
Globulin, Total: 2.6 g/dL (ref 1.5–4.5)
Glucose: 123 mg/dL — ABNORMAL HIGH (ref 65–99)
Potassium: 4.3 mmol/L (ref 3.5–5.2)
Sodium: 139 mmol/L (ref 134–144)
Total Bilirubin: 0.4 mg/dL (ref 0.0–1.2)
Total Protein: 7.3 g/dL (ref 6.0–8.5)

## 2012-05-09 LAB — HEMOGLOBIN A1C: Hgb A1c MFr Bld: 7.5 % — ABNORMAL HIGH (ref 4.8–5.6)

## 2012-05-15 ENCOUNTER — Encounter: Payer: Self-pay | Admitting: Internal Medicine

## 2012-05-15 ENCOUNTER — Ambulatory Visit (INDEPENDENT_AMBULATORY_CARE_PROVIDER_SITE_OTHER): Payer: 59 | Admitting: Internal Medicine

## 2012-05-15 VITALS — BP 120/68 | HR 76 | Temp 98.3°F | Resp 16 | Wt 258.0 lb

## 2012-05-15 DIAGNOSIS — F411 Generalized anxiety disorder: Secondary | ICD-10-CM

## 2012-05-15 DIAGNOSIS — E785 Hyperlipidemia, unspecified: Secondary | ICD-10-CM

## 2012-05-15 DIAGNOSIS — E1129 Type 2 diabetes mellitus with other diabetic kidney complication: Secondary | ICD-10-CM

## 2012-05-15 DIAGNOSIS — N058 Unspecified nephritic syndrome with other morphologic changes: Secondary | ICD-10-CM

## 2012-05-15 DIAGNOSIS — E669 Obesity, unspecified: Secondary | ICD-10-CM

## 2012-05-15 MED ORDER — GLUCOSE BLOOD VI STRP: ORAL_STRIP | Status: DC

## 2012-05-15 MED ORDER — SERTRALINE HCL 50 MG PO TABS: 50.0000 mg | ORAL_TABLET | Freq: Every day | ORAL | Status: DC

## 2012-05-15 NOTE — Progress Notes (Signed)
Patient ID: Victor Butler, male   DOB: 11-14-1970, 41 y.o.   MRN: [id number]  Patient Active Problem List  Diagnosis  . GERD (gastroesophageal reflux disease)  . Obesity (BMI 30-39.9)  . Other and unspecified hyperlipidemia  . Encounter for long-term (current) use of other medications  . Otitis media  . Type II or unspecified type diabetes mellitus with renal manifestations, not stated as uncontrolled  . Anxiety, generalized    Subjective:  CC:   Chief Complaint  Patient presents with  . Follow-up    HPI:   Victor Butler a 41 y.o. male who presents for  3 month follow up on hyperlipidemia, obesity , hypertension and diabetes mellitus.  He has had Increased stressors at work,  and has not been following a low glycemic index diet or exercising regularly. His anxiety level has increased due to his loss of control over his personal life and increased stressors at work. He is requesting a trial of SSRI specifically Zoloft as he has tolerated this in the past. .     Past Medical History  Diagnosis Date  . Diabetes mellitus   . GERD (gastroesophageal reflux disease)   . Hypercholesteremia   . Obesity (BMI 30-39.9)     No past surgical history on file.       The following portions of the patient's history were reviewed and updated as appropriate: Allergies, current medications, and problem list.    Review of Systems:   12 Pt  review of systems was negative except those addressed in the HPI,     History   Social History  . Marital Status: Married    Spouse Name: N/A    Number of Children: N/A  . Years of Education: N/A   Occupational History  . Not on file.   Social History Main Topics  . Smoking status: Never Smoker   . Smokeless tobacco: Never Used  . Alcohol Use: No  . Drug Use: No  . Sexually Active: Not on file   Other Topics Concern  . Not on file   Social History Narrative  . No narrative on file    Objective:  BP 120/68  Pulse 76   Temp 98.3 F (36.8 C) (Oral)  Resp 16  Wt 258 lb (117.028 kg)  SpO2 96%  General appearance: alert, cooperative and appears stated age Ears: normal TM's and external ear canals both ears Throat: lips, mucosa, and tongue normal; teeth and gums normal Neck: no adenopathy, no carotid bruit, supple, symmetrical, trachea midline and thyroid not enlarged, symmetric, no tenderness/mass/nodules Back: symmetric, no curvature. ROM normal. No CVA tenderness. Lungs: clear to auscultation bilaterally Heart: regular rate and rhythm, S1, S2 normal, no murmur, click, rub or gallop Abdomen: soft, non-tender; bowel sounds normal; no masses,  no organomegaly Pulses: 2+ and symmetric Skin: Skin color, texture, turgor normal. No rashes or lesions Lymph nodes: Cervical, supraclavicular, and axillary nodes normal.  Assessment and Plan:  Type II or unspecified type diabetes mellitus with renal manifestations, not stated as uncontrolled He has lost some control of his diabetes do to dietary indiscretions and lack of exercise. Hemoglobin A1c is currently 7.5. He has been checking his blood sugars in the morning fasting and they have been ranging from 125-160. We did not increase his medications today as he is motivated to resume his low glycemic index diet and daily exercise. We will recheck in 3 months.  Obesity (BMI 30-39.9) He has continued to to lower his BMI  with a low glycemic index diet, but his weight loss has begun to taper. He has not been exercising or following a diet do to increased work responsibilities. His wife is now motivated to begin Weight [employer] for her own obesity problem so he is motivated to  Other and unspecified hyperlipidemia LDL was less than 70 during April lab check and was not redone today. We will recheck lipids with next blood draw in 3 months. Continue current medications. Liver functions were normal today.  Anxiety, generalized We discussed resuming a trial of Zoloft per  patient request. He'll start with a half of a 50 mg tablet daily and increase to 1 tablet after one week. He will followup with me via my chart to discuss increasing the dose to 100 mg prior to next visit   Updated Medication List Outpatient Encounter Prescriptions as of 05/15/2012  Medication Sig Dispense Refill  . aspirin 81 MG EC tablet Take 81 mg by mouth daily.        . Cholecalciferol (VITAMIN D3) 400 UNITS CAPS Take 1,000 Units by mouth daily.       . fenofibrate (TRICOR) 145 MG tablet Take 1 tablet (145 mg total) by mouth daily. Refill as generic medication only.  90 tablet  3  . Lancets (ACCU-CHEK MULTICLIX) lancets Use as instructed  100 each  12  . lisinopril (PRINIVIL,ZESTRIL) 5 MG tablet Take one by mouth daily.  90 tablet  3  . loratadine (CLARITIN) 10 MG tablet Take 10 mg by mouth daily.        . metFORMIN (GLUCOPHAGE) 500 MG tablet Take 500 mg by mouth daily with breakfast.      . omeprazole (PRILOSEC) 20 MG capsule Take 20 mg by mouth daily as needed.       . phenylephrine (SUDAFED PE) 10 MG TABS Take 1 tablet (10 mg total) by mouth every 6 (six) hours as needed (congestion ).  30 tablet  1  . sildenafil (VIAGRA) 50 MG tablet Take 1 tablet (50 mg total) by mouth daily as needed for erectile dysfunction.  10 tablet  0  . DISCONTD: glucose blood (CHOICE DM FORA G20 TEST STRIPS) test strip Use as instructed  300 each  1  . glucose blood (ACCU-CHEK INSTANT GLUCOSE TEST) test strip Use as instructed  100 each  12  . sertraline (ZOLOFT) 50 MG tablet Take 1 tablet (50 mg total) by mouth daily.  30 tablet  5     No orders of the defined types were placed in this encounter.    No Follow-up on file.

## 2012-05-16 DIAGNOSIS — F411 Generalized anxiety disorder: Secondary | ICD-10-CM | POA: Insufficient documentation

## 2012-05-16 NOTE — Assessment & Plan Note (Signed)
He has continued to to lower his BMI with a low glycemic index diet, but his weight loss has begun to taper. He has not been exercising or following a diet do to increased work responsibilities. His wife is now motivated to begin Weight [employer] for her own obesity problem so he is motivated to

## 2012-05-16 NOTE — Assessment & Plan Note (Signed)
LDL was less than 70 during April lab check and was not redone today. We will recheck lipids with next blood draw in 3 months. Continue current medications. Liver functions were normal today.

## 2012-05-16 NOTE — Assessment & Plan Note (Signed)
We discussed resuming a trial of Zoloft per patient request. He'll start with a half of a 50 mg tablet daily and increase to 1 tablet after one week. He will followup with me via my chart to discuss increasing the dose to 100 mg prior to next visit

## 2012-05-16 NOTE — Assessment & Plan Note (Signed)
He has lost some control of his diabetes do to dietary indiscretions and lack of exercise. Hemoglobin A1c is currently 7.5. He has been checking his blood sugars in the morning fasting and they have been ranging from 125-160. We did not increase his medications today as he is motivated to resume his low glycemic index diet and daily exercise. We will recheck in 3 months.

## 2012-05-24 ENCOUNTER — Encounter: Payer: Self-pay | Admitting: Internal Medicine

## 2012-07-12 ENCOUNTER — Telehealth: Payer: Self-pay | Admitting: Internal Medicine

## 2012-07-12 NOTE — Telephone Encounter (Signed)
Caller: Merek/Patient; ; PCP: Duncan Dull (Adults only); Best Callback Phone Number: [phone]; Caller reports onset 07/09/12 of congestion, now with cough, afebrile. Productive cough greenish. Guideline: URI, Disposition: see within 24 hours due to productive cough with colored sputum, patient agrees to appintment 1415 07/13/12 with Dr Darrick Huntsman.

## 2012-07-13 ENCOUNTER — Ambulatory Visit (INDEPENDENT_AMBULATORY_CARE_PROVIDER_SITE_OTHER): Payer: 59 | Admitting: Internal Medicine

## 2012-07-13 ENCOUNTER — Encounter: Payer: Self-pay | Admitting: Internal Medicine

## 2012-07-13 VITALS — BP 124/82 | HR 96 | Temp 98.3°F | Ht 71.25 in | Wt 256.2 lb

## 2012-07-13 DIAGNOSIS — H6691 Otitis media, unspecified, right ear: Secondary | ICD-10-CM

## 2012-07-13 DIAGNOSIS — L259 Unspecified contact dermatitis, unspecified cause: Secondary | ICD-10-CM

## 2012-07-13 DIAGNOSIS — H6690 Otitis media, unspecified, unspecified ear: Secondary | ICD-10-CM

## 2012-07-13 MED ORDER — SERTRALINE HCL 50 MG PO TABS: 50.0000 mg | ORAL_TABLET | Freq: Every day | ORAL | Status: DC

## 2012-07-13 MED ORDER — BENZONATATE 200 MG PO CAPS: 200.0000 mg | ORAL_CAPSULE | Freq: Two times a day (BID) | ORAL | Status: DC | PRN

## 2012-07-13 MED ORDER — AMOXICILLIN-POT CLAVULANATE 875-125 MG PO TABS: 1.0000 | ORAL_TABLET | Freq: Two times a day (BID) | ORAL | Status: DC

## 2012-07-13 NOTE — Progress Notes (Signed)
Patient ID: Victor Butler, male   DOB: Jan 26, 1971, 42 y.o.   MRN: [id number]  Patient Active Problem List  Diagnosis  . GERD (gastroesophageal reflux disease)  . Obesity (BMI 30-39.9)  . Other and unspecified hyperlipidemia  . Encounter for long-term (current) use of other medications  . Type II or unspecified type diabetes mellitus with renal manifestations, not stated as uncontrolled  . Anxiety, generalized  . Otitis media of right ear  . Dermatitis, contact    Subjective:  CC:   Chief Complaint  Patient presents with  . Cough    HPI:   Rion Catala Foustis a 41 y.o. male who presents URI symptoms x 1 week.,  Weak, tired , Coughing,  hacking up green sputum.,  Started  Last week after splitting wood.  Began with sneezing and congestion which did not improve with OTC antihistamines and sudafed. No fevers or neck stiffness. No sick contacts.  2)  Itchy rash on both forearms. Developed after working outside cutting wood. Denies any spread of rash to other parts of body. No open sores.  Past Medical History  Diagnosis Date  . Diabetes mellitus   . GERD (gastroesophageal reflux disease)   . Hypercholesteremia   . Obesity (BMI 30-39.9)     No past surgical history on file.    The following portions of the patient's history were reviewed and updated as appropriate: Allergies, current medications, and problem list.    Review of Systems:   12 Pt  review of systems was negative except those addressed in the HPI,     History   Social History  . Marital Status: Married    Spouse Name: N/A    Number of Children: N/A  . Years of Education: N/A   Occupational History  . Not on file.   Social History Main Topics  . Smoking status: Never Smoker   . Smokeless tobacco: Never Used  . Alcohol Use: No  . Drug Use: No  . Sexually Active: Not on file   Other Topics Concern  . Not on file   Social History Narrative  . No narrative on file    Objective:  BP 124/82   Pulse 96  Temp 98.3 F (36.8 C) (Oral)  Ht 5' 11.25" (1.81 m)  Wt 256 lb 4 oz (116.234 kg)  BMI 35.49 kg/m2  SpO2 95%  General appearance: alert, cooperative and appears stated age Ears: bulding erythematous right TM, normal left TM Throat: lips, mucosa, and tongue normal; teeth and gums normal Neck: no adenopathy, no carotid bruit, supple, symmetrical, trachea midline and thyroid not enlarged, symmetric, no tenderness/mass/nodules Back: symmetric, no curvature. ROM normal. No CVA tenderness. Lungs: clear to auscultation bilaterally Heart: regular rate and rhythm, S1, S2 normal, no murmur, click, rub or gallop Abdomen: soft, non-tender; bowel sounds normal; no masses,  no organomegaly Pulses: 2+ and symmetric Skin: Skin color, texture, turgor normal. No rashes or lesions Lymph nodes: Cervical, supraclavicular, and axillary nodes normal.  Assessment and Plan:  Otitis media of right ear Secondary to prolonged upper respiratory symptoms with persistent congestion. Influenza test was checked due to his myalgias and malaise and was negative. Empiric treatment with Augmentin, decongestants, and Tessalon  Dermatitis, contact He is patches of poison ivy on both forearms due to recent exposure. Topical triamcinolone called into pharmacy.   Updated Medication List Outpatient Encounter Prescriptions as of 07/13/2012  Medication Sig Dispense Refill  . aspirin 81 MG EC tablet Take 81 mg by mouth daily.        Marland Kitchen  Cholecalciferol (VITAMIN D3) 400 UNITS CAPS Take 1,000 Units by mouth daily.       Marland Kitchen glucose blood (ACCU-CHEK INSTANT GLUCOSE TEST) test strip Use as instructed  100 each  12  . Lancets (ACCU-CHEK MULTICLIX) lancets Use as instructed  100 each  12  . lisinopril (PRINIVIL,ZESTRIL) 5 MG tablet Take one by mouth daily.  90 tablet  3  . loratadine (CLARITIN) 10 MG tablet Take 10 mg by mouth daily.        . metFORMIN (GLUCOPHAGE) 500 MG tablet Take 500 mg by mouth daily with breakfast.       . phenylephrine (SUDAFED PE) 10 MG TABS Take 1 tablet (10 mg total) by mouth every 6 (six) hours as needed (congestion ).  30 tablet  1  . sertraline (ZOLOFT) 50 MG tablet Take 1 tablet (50 mg total) by mouth daily.  90 tablet  3  . DISCONTD: sertraline (ZOLOFT) 50 MG tablet Take 1 tablet (50 mg total) by mouth daily.  30 tablet  5  . amoxicillin-clavulanate (AUGMENTIN) 875-125 MG per tablet Take 1 tablet by mouth 2 (two) times daily.  14 tablet  0  . benzonatate (TESSALON) 200 MG capsule Take 1 capsule (200 mg total) by mouth 2 (two) times daily as needed for cough.  60 capsule  0  . fenofibrate (TRICOR) 145 MG tablet Take 1 tablet (145 mg total) by mouth daily. Refill as generic medication only.  90 tablet  3  . omeprazole (PRILOSEC) 20 MG capsule Take 20 mg by mouth daily as needed.       . sildenafil (VIAGRA) 50 MG tablet Take 1 tablet (50 mg total) by mouth daily as needed for erectile dysfunction.  10 tablet  0  . triamcinolone cream (KENALOG) 0.1 % Apply topically 2 (two) times daily.  30 g  0     No orders of the defined types were placed in this encounter.    No Follow-up on file.

## 2012-07-13 NOTE — Patient Instructions (Addendum)
You have a sinus/ear infection   .  I am prescribing an antibiotic tha twill cover ears and sinuses  I also advise use of the following OTC meds to help with your other symptoms.   Take generic OTC benadryl 25 mg every 8 hours for the drainage,  Sudafed PE  10 to 30 mg every 8 hours for the congestion, you may substitute Afrin nasal spray for the nighttime dose of sudafed PE  If needed to prevent insomnia.  flushes your sinuses twice daily with Simply Saline (do over the sink because if you do it right you will spit out globs of mucus)  Use benzonatate capsules or OTC  Delsym   FOR THE COUGH.  Gargle with salt water as needed for sore throat.

## 2012-07-14 ENCOUNTER — Encounter: Payer: Self-pay | Admitting: Internal Medicine

## 2012-07-14 DIAGNOSIS — L259 Unspecified contact dermatitis, unspecified cause: Secondary | ICD-10-CM | POA: Insufficient documentation

## 2012-07-14 DIAGNOSIS — H6691 Otitis media, unspecified, right ear: Secondary | ICD-10-CM | POA: Insufficient documentation

## 2012-07-14 MED ORDER — TRIAMCINOLONE ACETONIDE 0.1 % EX CREA: TOPICAL_CREAM | Freq: Two times a day (BID) | CUTANEOUS | Status: DC

## 2012-07-14 NOTE — Assessment & Plan Note (Signed)
He is patches of poison ivy on both forearms due to recent exposure. Topical triamcinolone called into pharmacy.

## 2012-07-14 NOTE — Assessment & Plan Note (Signed)
Secondary to prolonged upper respiratory symptoms with persistent congestion. Influenza test was checked due to his myalgias and malaise and was negative. Empiric treatment with Augmentin, decongestants, and Tessalon

## 2012-10-12 LAB — HM DIABETES EYE EXAM: HM Diabetic Eye Exam: NORMAL

## 2012-10-18 ENCOUNTER — Telehealth: Payer: Self-pay | Admitting: Internal Medicine

## 2012-10-18 DIAGNOSIS — E119 Type 2 diabetes mellitus without complications: Secondary | ICD-10-CM

## 2012-10-18 DIAGNOSIS — E785 Hyperlipidemia, unspecified: Secondary | ICD-10-CM

## 2012-10-18 NOTE — Telephone Encounter (Signed)
Patient is needing a new lab requisition sent to  [facility] to have his labs draw before his follow up visit on 1.27.14.

## 2012-10-19 NOTE — Telephone Encounter (Signed)
Labs entered through [facility], hepatic panel lipid panel, hgba1c and urine microalb/cr ratio.

## 2012-10-23 ENCOUNTER — Encounter: Payer: Self-pay | Admitting: General Practice

## 2012-10-23 NOTE — Telephone Encounter (Signed)
Lab order created

## 2012-10-26 ENCOUNTER — Telehealth: Payer: Self-pay | Admitting: Internal Medicine

## 2012-10-26 NOTE — Telephone Encounter (Signed)
LMOVM for pt to return call and set up appt.

## 2012-10-26 NOTE — Telephone Encounter (Signed)
Diabetes is not under good control per recent labs.  A1c is now 8.1 and triglycerides are 400.  Please make appt to discuss labs

## 2012-10-29 ENCOUNTER — Ambulatory Visit (INDEPENDENT_AMBULATORY_CARE_PROVIDER_SITE_OTHER): Payer: 59 | Admitting: Internal Medicine

## 2012-10-29 ENCOUNTER — Encounter: Payer: Self-pay | Admitting: Internal Medicine

## 2012-10-29 VITALS — BP 128/82 | HR 62 | Temp 98.1°F | Resp 16 | Wt 262.8 lb

## 2012-10-29 DIAGNOSIS — E1129 Type 2 diabetes mellitus with other diabetic kidney complication: Secondary | ICD-10-CM

## 2012-10-29 DIAGNOSIS — E785 Hyperlipidemia, unspecified: Secondary | ICD-10-CM

## 2012-10-29 DIAGNOSIS — Z23 Encounter for immunization: Secondary | ICD-10-CM

## 2012-10-29 DIAGNOSIS — E669 Obesity, unspecified: Secondary | ICD-10-CM

## 2012-10-29 MED ORDER — FENOFIBRATE 145 MG PO TABS: 145.0000 mg | ORAL_TABLET | Freq: Every day | ORAL | Status: DC

## 2012-10-29 MED ORDER — ALPRAZOLAM 0.25 MG PO TABS: 0.2500 mg | ORAL_TABLET | Freq: Every evening | ORAL | Status: DC | PRN

## 2012-10-29 NOTE — Patient Instructions (Addendum)
Referral to Dr blocker or the health Dept  for travel medicine   Resume the fenofibrate and low GI diet.   Increase the dinner metformin to 1000 mg ,  If morning blood sugars continue to be > 125.  Add 2.5 mg glipizide before dinner    This is  my version of a  "Low GI"  Diet:  It is not ultra low carb, but will still lower your blood sugars and allow you to lose 5 to 10 lbs per month if you follow it carefully. All of the foods can be found at grocery stores and in bulk at [employer].  The Atkins protein bars and shakes are available in more varieties at [employer], [employer] and [employer].     7 AM Breakfast:  Low carbohydrate Protein  Shakes (I recommend the EAS AdvantEdge "Carb Control" shakes  Or the low carb shakes by Atkins.   Both are available everywhere:  In  cases at [employer]Js  Or in 4 packs at grocery stores and pharmacies  2.5 carbs  (Alternative is  a toasted Arnold's Sandwhich Thin w/ peanut butter, a "Bagel Thin" with cream cheese and salmon) or  a scrambled egg burrito made with a low carb tortilla .  Avoid cereal and bananas, oatmeal too unless you are cooking the old fashioned kind that takes 30-40 minutes to prepare.  the rest is overly processed, has minimal fiber, and is loaded with carbohydrates!   10 AM: Protein bar by Atkins (the snack size, under 200 cal).  There are many varieties , available widely again or in bulk in limited varieties at [employer]Js)  Other so called "protein bars" tend to be loaded with carbohydrates.  Remember, in food advertising, the word "energy" is synonymous for " carbohydrate."  Lunch: sandwich of [place], (or any lunchmeat, grilled meat or canned tuna), fresh avocado, mayonnaise  and cheese on a lower carbohydrate pita bread, flatbread, or tortilla . Ok to use regular mayonnaise. The bread is the only source or carbohydrate that can be decreased (Joseph's makes a pita bread and a flat bread that are 50 cal and 4 net carbs ; Toufayan makes a low carb flatbread that's  100 cal and 9 net carbs  and  Mission makes a low carb whole wheat tortilla  That is 210 cal and 6 net carbs)  3 PM:  Mid day :  Another protein bar,  Or a  cheese stick (100 cal, 0 carbs),  Or 1 ounce of  almonds, walnuts, pistachios, pecans, peanuts,  Macadamia nuts. Or a Dannon light n Fit greek yogurt, 80 cal 8 net carbs . Avoid "granola"; the dried cranberries and raisins are loaded with carbohydrates. Mixed nuts ok if no raisins or cranberries or dried fruit.      6 PM  Dinner:  "mean and green:"  Meat/chicken/fish or a high protein legume; , with a green salad, and a low GI  Veggie (broccoli, cauliflower, green beans, spinach, brussel sprouts. Lima beans) : Avoid "Low fat dressings, as well as Reyne Dumas and [address]! They are loaded with sugar! Instead use ranch, vinagrette,  Blue cheese, etc.  There is a low carb pasta by Dreamfield's available at [facility] that is acceptable and tastes great. Try Michel Angel's chicken piccata over low carb pasta. The chicken dish is 0 carbs, and can be found in frozen section at BJs and [employer]. Also try [employer] "Carnitas" (pulled pork, no sauce,  0 carbs) and his pot  roast.   both are in the refrigerated section at BJs   Dreamfield's makes a low carb pasta only 5 g/serving.  Available at all grocery stores,  And tastes like normal pasta  9 PM snack : Breyer's "low carb" fudgsicle or  ice cream bar (Carb Smart line), or  Weight Watcher's ice cream bar , or another "no sugar added" ice cream;a serving of fresh berries/cherries with whipped cream (Avoid bananas, pineapple, grapes  and watermelon on a regular basis because they are high in sugar)   Remember that snack Substitutions should be less than 10 carbs per serving and meals < 20 carbs. Remember to subtract fiber grams and sugar alcohols to get the "net carbs."

## 2012-10-29 NOTE — Assessment & Plan Note (Signed)
he is travelling to [place] with his daughter in a few months and needs vaccines . [facility]

## 2012-10-29 NOTE — Assessment & Plan Note (Addendum)
Uncontrolled due to medication an dietary nonadherence.  Resume meds,  low GI diet , add glipizide.  Foot exam normal.  Up to date on diabetic eye exam. Taking baby asa,  Ace inhbiitor and statin .

## 2012-10-29 NOTE — Progress Notes (Signed)
Patient ID: Victor Butler, male   DOB: May 11, 1971, 42 y.o.   MRN: [id number]  Patient Active Problem List  Diagnosis  . GERD (gastroesophageal reflux disease)  . Obesity (BMI 30-39.9)  . Other and unspecified hyperlipidemia  . Encounter for long-term (current) use of other medications  . Type II or unspecified type diabetes mellitus with renal manifestations, not stated as uncontrolled(250.40)  . Anxiety, generalized  . Otitis media of right ear  . Dermatitis, contact    Subjective:  CC:   Chief Complaint  Patient presents with  . Follow-up    Diabetes    HPI:   Victor Butler a 42 y.o. male who presents for follow up on diabetes, obesity and hyperlipidemia.  He stopped his meds a month and a half ago bc he attributed his recurrent chest pain to medications.  He skipped them for a few days then resumed them but he has not refilled his fenofibrate . He is not following a low glycemic index diet and is not exercising. Eating  poorly.  Has gained weight.  Wants to get back on track.  He also is upset that the information regarding his labs was left on his home phone line machine.  2) Going to [place] in July on a mission trip with 85 yr old daughter for 8 days. .  Needs several vaccines for his trip.    Last diabetic exam was 2 weeks ago. Foot exam was normal today   Sinus drainage and productive cough for 2 weeks.,  No fevers of flu symptoms     Worse in the morning and after coming in from cold.      Past Medical History  Diagnosis Date  . Diabetes mellitus   . GERD (gastroesophageal reflux disease)   . Hypercholesteremia   . Obesity (BMI 30-39.9)     History reviewed. No pertinent past surgical history.       The following portions of the patient's history were reviewed and updated as appropriate: Allergies, current medications, and problem list.    Review of Systems:   12 Pt  review of systems was negative except those addressed in the HPI,     History    Social History  . Marital Status: Married    Spouse Name: N/A    Number of Children: N/A  . Years of Education: N/A   Occupational History  . Not on file.   Social History Main Topics  . Smoking status: Never Smoker   . Smokeless tobacco: Never Used  . Alcohol Use: No  . Drug Use: No  . Sexually Active: Not on file   Other Topics Concern  . Not on file   Social History Narrative  . No narrative on file    Objective:  BP 128/82  Pulse 62  Temp 98.1 F (36.7 C) (Oral)  Resp 16  Wt 262 lb 12 oz (119.183 kg)  SpO2 96%  General appearance: alert, cooperative and appears stated age Ears: normal TM's and external ear canals both ears Throat: lips, mucosa, and tongue normal; teeth and gums normal Neck: no adenopathy, no carotid bruit, supple, symmetrical, trachea midline and thyroid not enlarged, symmetric, no tenderness/mass/nodules Back: symmetric, no curvature. ROM normal. No CVA tenderness. Lungs: clear to auscultation bilaterally Heart: regular rate and rhythm, S1, S2 normal, no murmur, click, rub or gallop Abdomen: soft, non-tender; bowel sounds normal; no masses,  no organomegaly Pulses: 2+ and symmetric Skin: Skin color, texture, turgor normal. No rashes or  lesions Lymph nodes: Cervical, supraclavicular, and axillary nodes normal.  Assessment and Plan:  Obesity (BMI 30-39.9) I have addressed  BMI and recommended resuming a low glycemic index diet utilizing smaller more frequent meals to increase metabolism.  I have also recommended that patient start exercising with a goal of 30 minutes of aerobic exercise a minimum of 5 days per week.    Other and unspecified hyperlipidemia Uncontrolled due to medication nonadherence. Marland Kitchen  Resume fenofibrate.   Type II or unspecified type diabetes mellitus with renal manifestations, not stated as uncontrolled(250.40) Uncontrolled due to medication an dietary nonadherence.  Resume meds,  low GI diet , add glipizide.  Foot exam  normal.  Up to date on diabetic eye exam. Taking baby asa,  Ace inhbiitor and statin .   Need for immunization against typhoid he is travelling to [place] with his daughter in a few months and needs vaccines . [facility].    Updated Medication List Outpatient Encounter Prescriptions as of 10/29/2012  Medication Sig Dispense Refill  . aspirin 81 MG EC tablet Take 81 mg by mouth daily.        . Cholecalciferol (VITAMIN D3) 400 UNITS CAPS Take 1,000 Units by mouth daily.       Marland Kitchen glucose blood (ACCU-CHEK INSTANT GLUCOSE TEST) test strip Use as instructed  100 each  12  . Lancets (ACCU-CHEK MULTICLIX) lancets Use as instructed  100 each  12  . lisinopril (PRINIVIL,ZESTRIL) 5 MG tablet Take one by mouth daily.  90 tablet  3  . loratadine (CLARITIN) 10 MG tablet Take 10 mg by mouth daily.        . metFORMIN (GLUCOPHAGE) 500 MG tablet Take 500 mg by mouth daily with breakfast.      . omeprazole (PRILOSEC) 20 MG capsule Take 20 mg by mouth daily as needed.       . phenylephrine (SUDAFED PE) 10 MG TABS Take 1 tablet (10 mg total) by mouth every 6 (six) hours as needed (congestion ).  30 tablet  1  . sertraline (ZOLOFT) 50 MG tablet Take 1 tablet (50 mg total) by mouth daily.  90 tablet  3  . [DISCONTINUED] triamcinolone cream (KENALOG) 0.1 % Apply topically 2 (two) times daily.  30 g  0  . ALPRAZolam (XANAX) 0.25 MG tablet Take 1 tablet (0.25 mg total) by mouth at bedtime as needed for sleep.  30 tablet  2  . fenofibrate (TRICOR) 145 MG tablet Take 1 tablet (145 mg total) by mouth daily. Refill as generic medication only.  90 tablet  3  . fenofibrate (TRICOR) 145 MG tablet Take 1 tablet (145 mg total) by mouth daily.  30 tablet  0  . sildenafil (VIAGRA) 50 MG tablet Take 1 tablet (50 mg total) by mouth daily as needed for erectile dysfunction.  10 tablet  0  . [DISCONTINUED] amoxicillin-clavulanate (AUGMENTIN) 875-125 MG per tablet Take 1 tablet by mouth 2 (two) times  daily.  14 tablet  0  . [DISCONTINUED] benzonatate (TESSALON) 200 MG capsule Take 1 capsule (200 mg total) by mouth 2 (two) times daily as needed for cough.  60 capsule  0  . [DISCONTINUED] fenofibrate (TRICOR) 145 MG tablet Take 1 tablet (145 mg total) by mouth daily.  30 tablet  0  . [DISCONTINUED] fenofibrate (TRICOR) 145 MG tablet Take 1 tablet (145 mg total) by mouth daily.  90 tablet  3

## 2012-10-29 NOTE — Telephone Encounter (Signed)
Pt seen today

## 2012-10-29 NOTE — Assessment & Plan Note (Signed)
I have addressed  BMI and recommended resuming a low glycemic index diet utilizing smaller more frequent meals to increase metabolism.  I have also recommended that patient start exercising with a goal of 30 minutes of aerobic exercise a minimum of 5 days per week.

## 2012-10-29 NOTE — Assessment & Plan Note (Signed)
Uncontrolled due to medication nonadherence. Marland Kitchen  Resume fenofibrate.

## 2012-11-07 ENCOUNTER — Encounter: Payer: Self-pay | Admitting: Internal Medicine

## 2012-11-17 ENCOUNTER — Other Ambulatory Visit: Payer: Self-pay

## 2012-11-21 ENCOUNTER — Encounter: Payer: Self-pay | Admitting: Adult Health

## 2012-11-21 ENCOUNTER — Ambulatory Visit (INDEPENDENT_AMBULATORY_CARE_PROVIDER_SITE_OTHER): Payer: 59 | Admitting: Adult Health

## 2012-11-21 VITALS — BP 130/84 | HR 90 | Temp 99.0°F | Resp 14 | Ht 71.0 in | Wt 263.0 lb

## 2012-11-21 DIAGNOSIS — L03039 Cellulitis of unspecified toe: Secondary | ICD-10-CM

## 2012-11-21 DIAGNOSIS — L03031 Cellulitis of right toe: Secondary | ICD-10-CM

## 2012-11-21 MED ORDER — DOXYCYCLINE HYCLATE 100 MG PO TABS: 100.0000 mg | ORAL_TABLET | Freq: Two times a day (BID) | ORAL | Status: DC

## 2012-11-21 NOTE — Progress Notes (Signed)
  Subjective:    Patient ID: Victor Butler, male    DOB: 02-Jun-1971, 42 y.o.   MRN: [id number]  HPI  Patient presents to clinic with pain on right great toe. He thinks he may have an ingrown toe nail. Patient is a diabetic.   Current Outpatient Prescriptions on File Prior to Visit  Medication Sig Dispense Refill  . ALPRAZolam (XANAX) 0.25 MG tablet Take 1 tablet (0.25 mg total) by mouth at bedtime as needed for sleep.  30 tablet  2  . aspirin 81 MG EC tablet Take 81 mg by mouth daily.        . Cholecalciferol (VITAMIN D3) 400 UNITS CAPS Take 1,000 Units by mouth daily.       . fenofibrate (TRICOR) 145 MG tablet Take 1 tablet (145 mg total) by mouth daily.  30 tablet  0  . glucose blood (ACCU-CHEK INSTANT GLUCOSE TEST) test strip Use as instructed  100 each  12  . Lancets (ACCU-CHEK MULTICLIX) lancets Use as instructed  100 each  12  . lisinopril (PRINIVIL,ZESTRIL) 5 MG tablet Take one by mouth daily.  90 tablet  3  . loratadine (CLARITIN) 10 MG tablet Take 10 mg by mouth daily.        Marland Kitchen omeprazole (PRILOSEC) 20 MG capsule Take 20 mg by mouth daily as needed.       . phenylephrine (SUDAFED PE) 10 MG TABS Take 1 tablet (10 mg total) by mouth every 6 (six) hours as needed (congestion ).  30 tablet  1  . fenofibrate (TRICOR) 145 MG tablet Take 1 tablet (145 mg total) by mouth daily. Refill as generic medication only.  90 tablet  3  . metFORMIN (GLUCOPHAGE) 500 MG tablet Take 500 mg by mouth daily with breakfast.      . sertraline (ZOLOFT) 50 MG tablet Take 1 tablet (50 mg total) by mouth daily.  90 tablet  3  . sildenafil (VIAGRA) 50 MG tablet Take 1 tablet (50 mg total) by mouth daily as needed for erectile dysfunction.  10 tablet  0   No current facility-administered medications on file prior to visit.     Review of Systems  Constitutional: Negative for fever and chills.  Skin:       Redness and pain on nail of right great toe  Neurological: Negative for numbness.    BP 130/84   Pulse 90  Temp(Src) 99 F (37.2 C) (Oral)  Resp 14  Ht 5\' 11"  (1.803 m)  Wt 263 lb (119.296 kg)  BMI 36.7 kg/m2  SpO2 95%     Objective:   Physical Exam  Constitutional: He is oriented to person, place, and time. He appears well-developed and well-nourished. No distress.  Cardiovascular: Intact distal pulses.   Musculoskeletal: He exhibits edema and tenderness.  Right great toe  Neurological: He is alert and oriented to person, place, and time.  Skin: Skin is warm and dry. There is erythema.  Redness, erythema and mild inflammation on lateral side of right great toe. Edges of nail bed not noted to be ingrown. There is no drainage from area.  Psychiatric: He has a normal mood and affect. His behavior is normal. Judgment and thought content normal.          Assessment & Plan:

## 2012-11-21 NOTE — Patient Instructions (Addendum)
  Soak foot several times daily in Epson salt.  Start the antibiotic twice daily for 10 days.  If your symptoms do not begin to improve within the next 3-4 days please call.

## 2012-11-25 ENCOUNTER — Encounter: Payer: Self-pay | Admitting: Adult Health

## 2012-11-25 DIAGNOSIS — L03031 Cellulitis of right toe: Secondary | ICD-10-CM | POA: Insufficient documentation

## 2012-11-25 NOTE — Assessment & Plan Note (Signed)
Nail does not appear ingrown. He has low grade temp today (99). Will start doxycycline x 10 days. Instructed to soak in epsom salts. OTC pain relief such as tylenol or advil. RTC if worsening symptoms or if no resoluction within 3-4 days.

## 2013-01-09 ENCOUNTER — Ambulatory Visit (INDEPENDENT_AMBULATORY_CARE_PROVIDER_SITE_OTHER): Payer: 59 | Admitting: Adult Health

## 2013-01-09 ENCOUNTER — Ambulatory Visit: Payer: 59 | Admitting: Adult Health

## 2013-01-09 ENCOUNTER — Encounter: Payer: Self-pay | Admitting: Adult Health

## 2013-01-09 VITALS — BP 130/88 | HR 63 | Temp 98.3°F | Resp 16 | Wt 260.8 lb

## 2013-01-09 DIAGNOSIS — R0981 Nasal congestion: Secondary | ICD-10-CM

## 2013-01-09 DIAGNOSIS — J3489 Other specified disorders of nose and nasal sinuses: Secondary | ICD-10-CM

## 2013-01-09 MED ORDER — PREDNISONE 10 MG PO TABS: ORAL_TABLET | ORAL | Status: DC

## 2013-01-09 NOTE — Patient Instructions (Addendum)
  Start the nasonex nasal spray - 2 sprays into each nostril at bedtime.  Take Dimetapp Elixir for your ear congestion (decongestant)  If your symptoms are not improved within 1 week then start the prednisone taper.

## 2013-01-09 NOTE — Progress Notes (Signed)
  Subjective:    Patient ID: Victor Butler, male    DOB: 1971-03-25, 42 y.o.   MRN: [id number]  HPI  Patient is a pleasant 42 year old male who presents to clinic with complaints of congestion, postnasal drip, and ear pressure. Patient's son was recently diagnosed with influenza and started on Tamiflu.  Patient is currently completing a course of amoxicillin for sinus infection. He denies fever, chills, headache, shortness of breath or chest pain.   Current Outpatient Prescriptions on File Prior to Visit  Medication Sig Dispense Refill  . ALPRAZolam (XANAX) 0.25 MG tablet Take 1 tablet (0.25 mg total) by mouth at bedtime as needed for sleep.  30 tablet  2  . aspirin 81 MG EC tablet Take 81 mg by mouth daily.        . Cholecalciferol (VITAMIN D3) 400 UNITS CAPS Take 1,000 Units by mouth daily.       . fenofibrate (TRICOR) 145 MG tablet Take 1 tablet (145 mg total) by mouth daily.  30 tablet  0  . glucose blood (ACCU-CHEK INSTANT GLUCOSE TEST) test strip Use as instructed  100 each  12  . lisinopril (PRINIVIL,ZESTRIL) 5 MG tablet Take one by mouth daily.  90 tablet  3  . loratadine (CLARITIN) 10 MG tablet Take 10 mg by mouth daily.        Marland Kitchen omeprazole (PRILOSEC) 20 MG capsule Take 20 mg by mouth daily as needed.       . phenylephrine (SUDAFED PE) 10 MG TABS Take 1 tablet (10 mg total) by mouth every 6 (six) hours as needed (congestion ).  30 tablet  1  . sertraline (ZOLOFT) 50 MG tablet Take 1 tablet (50 mg total) by mouth daily.  90 tablet  3  . Lancets (ACCU-CHEK MULTICLIX) lancets Use as instructed  100 each  12  . metFORMIN (GLUCOPHAGE) 500 MG tablet Take 500 mg by mouth daily with breakfast.      . sildenafil (VIAGRA) 50 MG tablet Take 1 tablet (50 mg total) by mouth daily as needed for erectile dysfunction.  10 tablet  0   No current facility-administered medications on file prior to visit.     Review of Systems  Constitutional: Positive for fever.  HENT: Positive for congestion,  postnasal drip and sinus pressure.        Fullness feeling in ears.  Respiratory: Positive for cough.    BP 130/88  Pulse 63  Temp(Src) 98.3 F (36.8 C) (Oral)  Resp 16  Wt 260 lb 12 oz (118.275 kg)  BMI 36.38 kg/m2  SpO2 96%     Objective:   Physical Exam  Constitutional: He is oriented to person, place, and time. He appears well-developed and well-nourished.  HENT:  Head: Normocephalic and atraumatic.  Right Ear: External ear normal.  Left Ear: External ear normal.  Drainage noted posterior pharynx. There is no exudate.  Cardiovascular: Normal rate and regular rhythm.   Pulmonary/Chest: Effort normal and breath sounds normal.  Lymphadenopathy:    He has no cervical adenopathy.  Neurological: He is alert and oriented to person, place, and time.  Psychiatric: He has a normal mood and affect. His behavior is normal.       Assessment & Plan:

## 2013-01-10 ENCOUNTER — Encounter: Payer: Self-pay | Admitting: Adult Health

## 2013-01-10 DIAGNOSIS — R0981 Nasal congestion: Secondary | ICD-10-CM | POA: Insufficient documentation

## 2013-01-10 NOTE — Assessment & Plan Note (Addendum)
Patient is already on antibiotics and will complete course tomorrow. Recommend Dimetapp as decongestant. Provided patient with sample of Nasacort to see if this improves his symptoms.

## 2013-01-22 ENCOUNTER — Other Ambulatory Visit: Payer: Self-pay | Admitting: *Deleted

## 2013-01-22 MED ORDER — LISINOPRIL 5 MG PO TABS: ORAL_TABLET | ORAL | Status: DC

## 2013-01-22 NOTE — Telephone Encounter (Signed)
Patient called stating that he is almost out of his Lisinopril and requested that a script be sent to his mail order pharmacy, [facility].  Patient states that he thinks that he has enough to last until he gets the medication from his mail order.pharmacy.

## 2013-02-26 ENCOUNTER — Telehealth: Payer: Self-pay | Admitting: Internal Medicine

## 2013-02-26 DIAGNOSIS — E119 Type 2 diabetes mellitus without complications: Secondary | ICD-10-CM

## 2013-02-26 NOTE — Telephone Encounter (Signed)
[phone] fax Pt would like  Lab order faxed to [facility]  He has appointment 6/10

## 2013-02-26 NOTE — Telephone Encounter (Signed)
Please advise as to labs and will fax order.

## 2013-02-26 NOTE — Telephone Encounter (Signed)
Left message for patient to return call to office. 

## 2013-02-26 NOTE — Telephone Encounter (Signed)
Fasting lab iorder sent to [facility].

## 2013-02-27 NOTE — Telephone Encounter (Signed)
Can someone please transfer the oredrred labs to a [facility] sheet and I will sign?  thanks

## 2013-02-27 NOTE — Telephone Encounter (Signed)
Patient called back and would like a hard copy of his lab order just in case [facility] don't have it when he goes to get his labs.  Fax [phone]

## 2013-02-28 NOTE — Telephone Encounter (Signed)
Filled out for you to sign tomorrow.

## 2013-03-01 NOTE — Telephone Encounter (Signed)
Faxed to patient and placed up front for pickup.

## 2013-03-02 ENCOUNTER — Ambulatory Visit (INDEPENDENT_AMBULATORY_CARE_PROVIDER_SITE_OTHER): Payer: 59 | Admitting: Family Medicine

## 2013-03-02 VITALS — BP 120/76 | HR 79 | Temp 97.9°F | Resp 18 | Ht 71.5 in | Wt 262.0 lb

## 2013-03-02 DIAGNOSIS — Z23 Encounter for immunization: Secondary | ICD-10-CM

## 2013-03-02 DIAGNOSIS — J069 Acute upper respiratory infection, unspecified: Secondary | ICD-10-CM

## 2013-03-02 MED ORDER — TYPHOID VACCINE PO CPDR: 1.0000 | DELAYED_RELEASE_CAPSULE | ORAL | Status: AC

## 2013-03-02 NOTE — Progress Notes (Signed)
  Subjective:    Patient ID: Victor Butler, male    DOB: 16-Jul-1971, 42 y.o.   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  Will apparently be in area that is outside of Malaria prophylaxis requirement.  Here for immunization review. Last td: 02/09/2011.  Has not had Hep A or B vaccination. Will need typhoid immunization.   ON metformin for Diabetes - recently good control. On ace inhibitor, gemfibrozil.   Review of Systems  Constitutional: Negative for fever and chills.  Gastrointestinal: Negative for vomiting, abdominal pain and diarrhea.   Has had congestion and a slight cough for the past 2 or 3 days, with slight discolored phlegm today. No fever, other as above.     Objective:   Physical Exam  Constitutional: He is oriented to person, place, and time. He appears well-developed and well-nourished.  HENT:  Head: Normocephalic and atraumatic.  Right Ear: Tympanic membrane, external ear and ear canal normal.  Left Ear: Tympanic membrane, external ear and ear canal normal.  Nose: No rhinorrhea.  Mouth/Throat: Oropharynx is clear and moist and mucous membranes are normal. No oropharyngeal exudate or posterior oropharyngeal erythema.  Eyes: Conjunctivae and EOM are normal. Pupils are equal, round, and reactive to light.  Neck: Neck supple. Carotid bruit is not present.  Cardiovascular: Normal rate, regular rhythm, normal heart sounds and intact distal pulses.   No murmur heard. Pulmonary/Chest: Effort normal and breath sounds normal. He has no wheezes. He has no rhonchi. He has no rales.  Abdominal: Soft. There is no tenderness.  Musculoskeletal: He exhibits no edema.  Lymphadenopathy:    He has no cervical adenopathy.  Neurological: He is alert and oriented to person, place, and time.  Skin: Skin is warm and dry. No rash noted.  Psychiatric: He has a normal mood and affect. His behavior is normal.      Assessment & Plan:   Victor Butler is a 42 y.o. male Acute upper respiratory infections of unspecified site  Need for immunization against typhoid - Plan: typhoid (VIVOTIF BERNA VACCINE) DR capsule  Need for hepatitis A immunization - Plan: Hepatitis A vaccine adult IM   Immunization review for travel. Will be outside of Malaria area.  Hep A vaccine #1 given today for partial protection - repeat in 6 months.  Rx for Vivotif given for typhoid vaccination, but not to start until after acute illness, should compete prior to 1 week before travel. rtc if fever, increasing cough or worsening of likely viral URI at present.   Meds ordered this encounter  Medications  . typhoid (VIVOTIF BERNA VACCINE) DR capsule    Sig: Take 1 capsule by mouth every other day.    Dispense:  4 capsule    Refill:  0     Patient Instructions  Do not start the typhoid medicine until your current illness has resolved, but it should be completed 1 week prior to travel. 2nd Hepatitis A vaccine in 6 months. Return to the clinic or go to the nearest emergency room if any of your symptoms worsen or new symptoms occur.

## 2013-03-02 NOTE — Patient Instructions (Addendum)
Do not start the typhoid medicine until your current illness has resolved, but it should be completed 1 week prior to travel. 2nd Hepatitis A vaccine in 6 months. Return to the clinic or go to the nearest emergency room if any of your symptoms worsen or new symptoms occur.

## 2013-03-11 ENCOUNTER — Telehealth: Payer: Self-pay | Admitting: Internal Medicine

## 2013-03-11 DIAGNOSIS — E785 Hyperlipidemia, unspecified: Secondary | ICD-10-CM

## 2013-03-11 DIAGNOSIS — E1129 Type 2 diabetes mellitus with other diabetic kidney complication: Secondary | ICD-10-CM

## 2013-03-11 NOTE — Telephone Encounter (Signed)
Labs received,  Has appt tomorrow,  Please remind him to bring his log of blood sugars if he has it bc hisw diabetes is out of control.,  And his triglycerides are 700,  Is he taking the tricor ?

## 2013-03-11 NOTE — Assessment & Plan Note (Signed)
Trigs 700

## 2013-03-11 NOTE — Telephone Encounter (Signed)
Left message for patient to return call to office. 

## 2013-03-12 ENCOUNTER — Encounter: Payer: Self-pay | Admitting: Internal Medicine

## 2013-03-12 ENCOUNTER — Ambulatory Visit (INDEPENDENT_AMBULATORY_CARE_PROVIDER_SITE_OTHER): Payer: 59 | Admitting: Internal Medicine

## 2013-03-12 VITALS — BP 128/78 | HR 67 | Temp 98.3°F | Resp 16 | Wt 263.0 lb

## 2013-03-12 DIAGNOSIS — R05 Cough: Secondary | ICD-10-CM | POA: Insufficient documentation

## 2013-03-12 DIAGNOSIS — E785 Hyperlipidemia, unspecified: Secondary | ICD-10-CM

## 2013-03-12 DIAGNOSIS — E1129 Type 2 diabetes mellitus with other diabetic kidney complication: Secondary | ICD-10-CM

## 2013-03-12 MED ORDER — GLIPIZIDE 5 MG PO TABS: 5.0000 mg | ORAL_TABLET | Freq: Two times a day (BID) | ORAL | Status: DC

## 2013-03-12 MED ORDER — LOSARTAN POTASSIUM 50 MG PO TABS: 50.0000 mg | ORAL_TABLET | Freq: Every day | ORAL | Status: DC

## 2013-03-12 MED ORDER — METFORMIN HCL 1000 MG PO TABS: 1000.0000 mg | ORAL_TABLET | Freq: Every day | ORAL | Status: DC

## 2013-03-12 NOTE — Assessment & Plan Note (Signed)
Uncontrolled by current labs.  A1c is 9.4.  He is motivated to return to a low glycemic diet which he has done very well on in he past.  Will increase metformin to 1000 mg bid and add glucotrol 5 mg bid. Discussed optio nof adding evening NPH to achieve fasting normoglycemia, but he would like to defer foe a trial of orals and diet,

## 2013-03-12 NOTE — Assessment & Plan Note (Signed)
Suspend lisinopril, add nasonex.

## 2013-03-12 NOTE — Progress Notes (Addendum)
Patient ID: Victor Butler, male   DOB: Sep 09, 1971, 42 y.o.   MRN: [id number]    Patient Active Problem List   Diagnosis Date Noted  . Cough 03/12/2013  . Sinus congestion 01/10/2013  . Paronychia of great toe, right 11/25/2012  . Need for immunization against typhoid 10/29/2012  . Dermatitis, contact 07/14/2012  . Anxiety, generalized 05/16/2012  . 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 Other and unspecified hyperlipidemia 06/19/2011  . Encounter for long-term (current) use of other medications 06/19/2011  . GERD (gastroesophageal reflux disease)   . Obesity (BMI 30-39.9)     Subjective:  CC:   Chief Complaint  Patient presents with  . Follow-up    HPI:   Victor Lindon Foustis a 42 y.o. male who presents for follow up on diabetes, hyperlipidemia and obesity .  Last seen in January . He reports medication and dietary noncompliance foe the last several months for reasons related to increased emotional stressors.    He has had a persistent cough, minimal sputum  Production , to the point of choking for the past two weeks, ever since he received the typhoid vaccine ,for his future trip to [place] with his church.  No allergy symptoms but has chronic congestion.  Occasional wheezing,  Not worse with supine position.   Past Medical History  Diagnosis Date  . Diabetes mellitus   . GERD (gastroesophageal reflux disease)   . Hypercholesteremia   . Obesity (BMI 30-39.9)     History reviewed. No pertinent past surgical history.     The following portions of the patient's history were reviewed and updated as appropriate: Allergies, current medications, and problem list.    Review of Systems:   Patient denies headache, fevers, malaise, unintentional weight loss, skin rash, eye pain, sinus congestion and sinus pain, sore throat, dysphagia,  hemoptysis , cough, dyspnea, wheezing, chest pain,  palpitations, orthopnea, edema, abdominal pain, nausea, melena, diarrhea, constipation, flank pain, dysuria, hematuria, urinary  Frequency, nocturia, numbness, tingling, seizures,  Focal weakness, Loss of consciousness,  Tremor, insomnia, depression, anxiety, and suicidal ideation.     History   Social History  . Marital Status: Married    Spouse Name: N/A    Number of Children: N/A  . Years of Education: N/A   Occupational History  . Not on file.   Social History Main Topics  . Smoking status: Never Smoker   . Smokeless tobacco: Never Used  . Alcohol Use: No  . Drug Use: No  . Sexually Active: Not on file   Other Topics Concern  . Not on file   Social History Narrative  . No narrative on file    Objective:  BP 128/78  Pulse 67  Temp(Src) 98.3 F (36.8 C) (Oral)  Resp 16  Wt 263 lb (119.296 kg)  BMI 36.17 kg/m2  SpO2 98%  General appearance: alert, cooperative and appears stated age Ears: normal TM's and external ear canals both ears Throat: lips, mucosa, and tongue normal; teeth and gums normal Neck: no adenopathy, no carotid bruit, supple, symmetrical, trachea midline and thyroid not enlarged, symmetric, no tenderness/mass/nodules Back: symmetric, no curvature. ROM normal. No CVA tenderness. Lungs: clear to auscultation bilaterally Heart: regular rate and rhythm, S1, S2 normal, no murmur, click, rub or gallop Abdomen: soft, non-tender; bowel sounds normal; no masses,  no organomegaly Pulses: 2+ and symmetric Skin: Skin color, texture, turgor normal. No rashes or lesions Lymph nodes: Cervical,  supraclavicular, and axillary nodes normal.  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  A1c is 9.4.  He is motivated to return to a low glycemic diet which he has done very well on in he past.  Will increase metformin to 1000 mg bid and add glucotrol 5 mg  bid. Discussed optio nof adding evening NPH to achieve fasting normoglycemia, but he would like to defer foe a trial of orals and diet,   Cough Suspend lisinopril, add nasonex.   Other and unspecified hyperlipidemia triglycerides  Of 700 on recent labs.  He has not been taking tricor  For several weeks.  Advised to resume meds and low GI diet.    Updated Medication List Outpatient Encounter Prescriptions as of 03/12/2013  Medication Sig Dispense Refill  . ALPRAZolam (XANAX) 0.25 MG tablet Take 1 tablet (0.25 mg total) by mouth at bedtime as needed for sleep.  30 tablet  2  . aspirin 81 MG EC tablet Take 81 mg by mouth daily.        . Cholecalciferol (VITAMIN D3) 400 UNITS CAPS Take 1,000 Units by mouth daily.       . fenofibrate (TRICOR) 145 MG tablet Take 1 tablet (145 mg total) by mouth daily.  30 tablet  0  . glucose blood (ACCU-CHEK INSTANT GLUCOSE TEST) test strip Use as instructed  100 each  12  . loratadine (CLARITIN) 10 MG tablet Take 10 mg by mouth daily.        Marland Kitchen omeprazole (PRILOSEC) 20 MG capsule Take 20 mg by mouth daily as needed.       . sertraline (ZOLOFT) 50 MG tablet Take 1 tablet (50 mg total) by mouth daily.  90 tablet  3  . typhoid (VIVOTIF BERNA VACCINE) DR capsule Take 1 capsule by mouth every other day.  4 capsule  0  . [DISCONTINUED] lisinopril (PRINIVIL,ZESTRIL) 5 MG tablet Take one by mouth daily.  90 tablet  1  . glipiZIDE (GLUCOTROL) 5 MG tablet Take 1 tablet (5 mg total) by mouth 2 (two) times daily before a meal.  180 tablet  3  . Lancets (ACCU-CHEK MULTICLIX) lancets Use as instructed  100 each  12  . losartan (COZAAR) 50 MG tablet Take 1 tablet (50 mg total) by mouth daily.  30 tablet  3  . metFORMIN (GLUCOPHAGE) 1000 MG tablet Take 1 tablet (1,000 mg total) by mouth daily with breakfast.  180 tablet  3  . sildenafil (VIAGRA) 50 MG tablet Take 1 tablet (50 mg total) by mouth daily as needed for erectile dysfunction.  10 tablet  0  . [DISCONTINUED] metFORMIN  (GLUCOPHAGE) 500 MG tablet Take 500 mg by mouth daily with breakfast.       No facility-administered encounter medications on file as of 03/12/2013.

## 2013-03-12 NOTE — Assessment & Plan Note (Addendum)
triglycerides  Of 700 on recent labs.  He has not been taking tricor  For several weeks.  Advised to resume meds and low GI diet.

## 2013-03-12 NOTE — Patient Instructions (Addendum)
resume nasonex and continue claritin.  If no change in 1 to 2 weeks,  Call to arrange a chest x ray  Stop lisinopril.  Losartan sent to pharemacy as replacement .Marland Kitchen  Increase the metformin to 1000 mg twice daily and add glipizide 5 mg twice daily   Get back on the low carb diet !!!  Make time to exercise !

## 2013-03-18 ENCOUNTER — Encounter: Payer: Self-pay | Admitting: Internal Medicine

## 2013-03-18 ENCOUNTER — Telehealth: Payer: Self-pay | Admitting: *Deleted

## 2013-03-18 MED ORDER — AMOXICILLIN-POT CLAVULANATE 875-125 MG PO TABS: 1.0000 | ORAL_TABLET | Freq: Two times a day (BID) | ORAL | Status: DC

## 2013-03-18 NOTE — Telephone Encounter (Signed)
Refill Request  T/S Accu-Chek Smartview Strip

## 2013-03-19 MED ORDER — GLUCOSE BLOOD VI STRP: ORAL_STRIP | Status: DC

## 2013-03-19 NOTE — Telephone Encounter (Signed)
Med refilled.

## 2013-03-20 ENCOUNTER — Telehealth: Payer: Self-pay | Admitting: *Deleted

## 2013-03-20 ENCOUNTER — Encounter: Payer: Self-pay | Admitting: Internal Medicine

## 2013-03-20 MED ORDER — GLUCOSE BLOOD VI STRP: ORAL_STRIP | Status: DC

## 2013-03-20 NOTE — Telephone Encounter (Signed)
Rx faxed to [facility] per pt request.

## 2013-03-20 NOTE — Telephone Encounter (Signed)
Pharamcy will not fill test strips until they have a frequency for testing, please advise.

## 2013-03-20 NOTE — Telephone Encounter (Signed)
Resent with instructions to check sugars three times daily

## 2013-04-11 ENCOUNTER — Other Ambulatory Visit: Payer: Self-pay | Admitting: *Deleted

## 2013-04-11 NOTE — Telephone Encounter (Signed)
error 

## 2013-04-18 ENCOUNTER — Encounter: Payer: Self-pay | Admitting: Internal Medicine

## 2013-04-18 MED ORDER — LOSARTAN POTASSIUM 50 MG PO TABS: 50.0000 mg | ORAL_TABLET | Freq: Every day | ORAL | Status: DC

## 2013-04-18 NOTE — Telephone Encounter (Signed)
Victor Butler,  I cannot really tell you whether your diarrhea is from the increased metformin dose or something you caught in [place] by e mail. you should suspend your metformin and see if it resolves.  If not, please make an appt. With me or one of the other providers to investigate further   Dr. Lottie Mussel

## 2013-04-19 IMAGING — CR DG CHEST 2V
1 series · 2 of 2 positions shown · non-contrast
Comparison: none

REASON FOR EXAM: chest pain at rest
COMMENTS:

PROCEDURE:     DXR - DXR CHEST PA (OR AP) AND LATERAL  - November 22, 2011  [DATE]
RESULT:     The lung fields are clear. The heart, mediastinal and osseous
structures show no significant abnormalities.

[Series 1: w chest pa · 0.14mm/px · 2 of 2 slices shown]
[im 1/2]
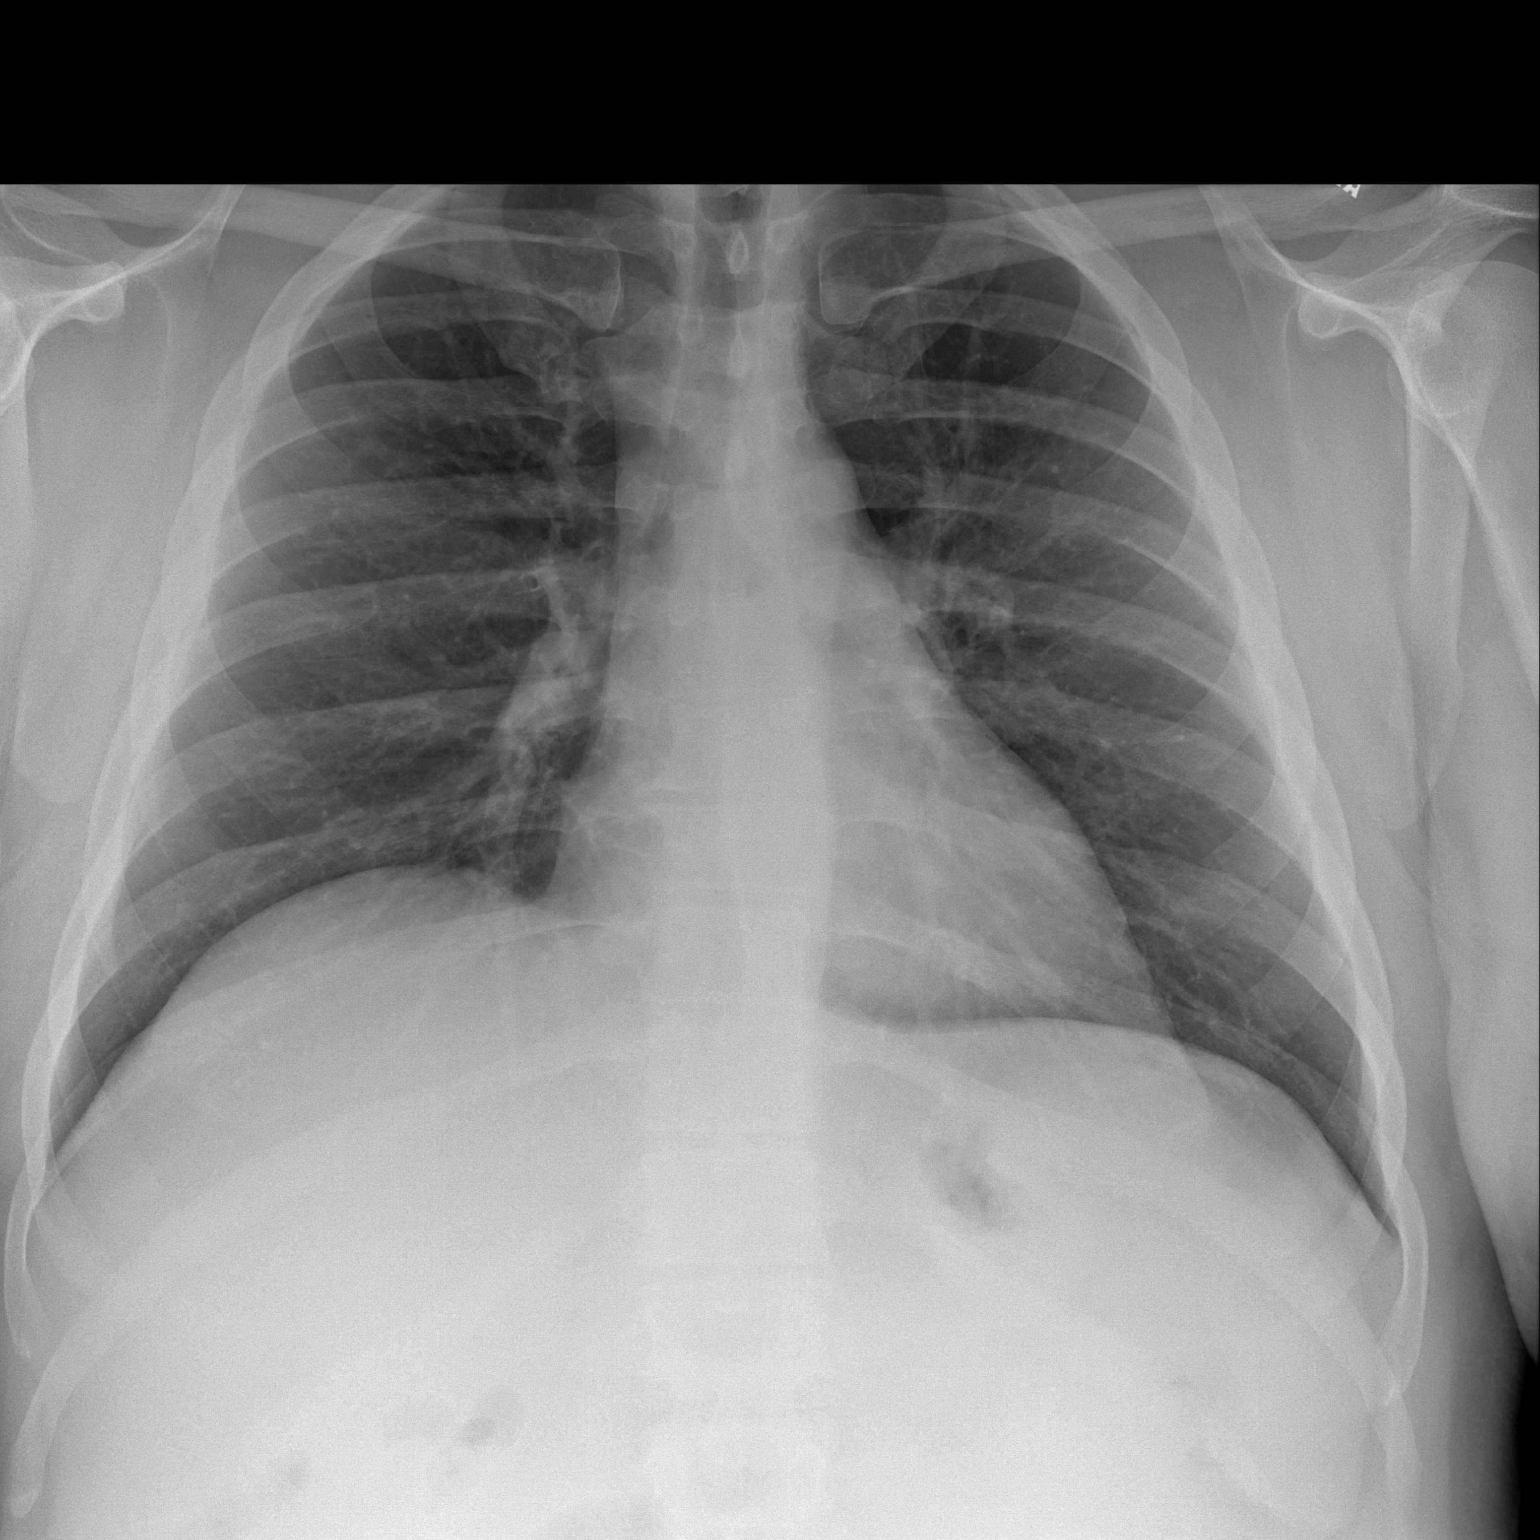
[im 2/2]
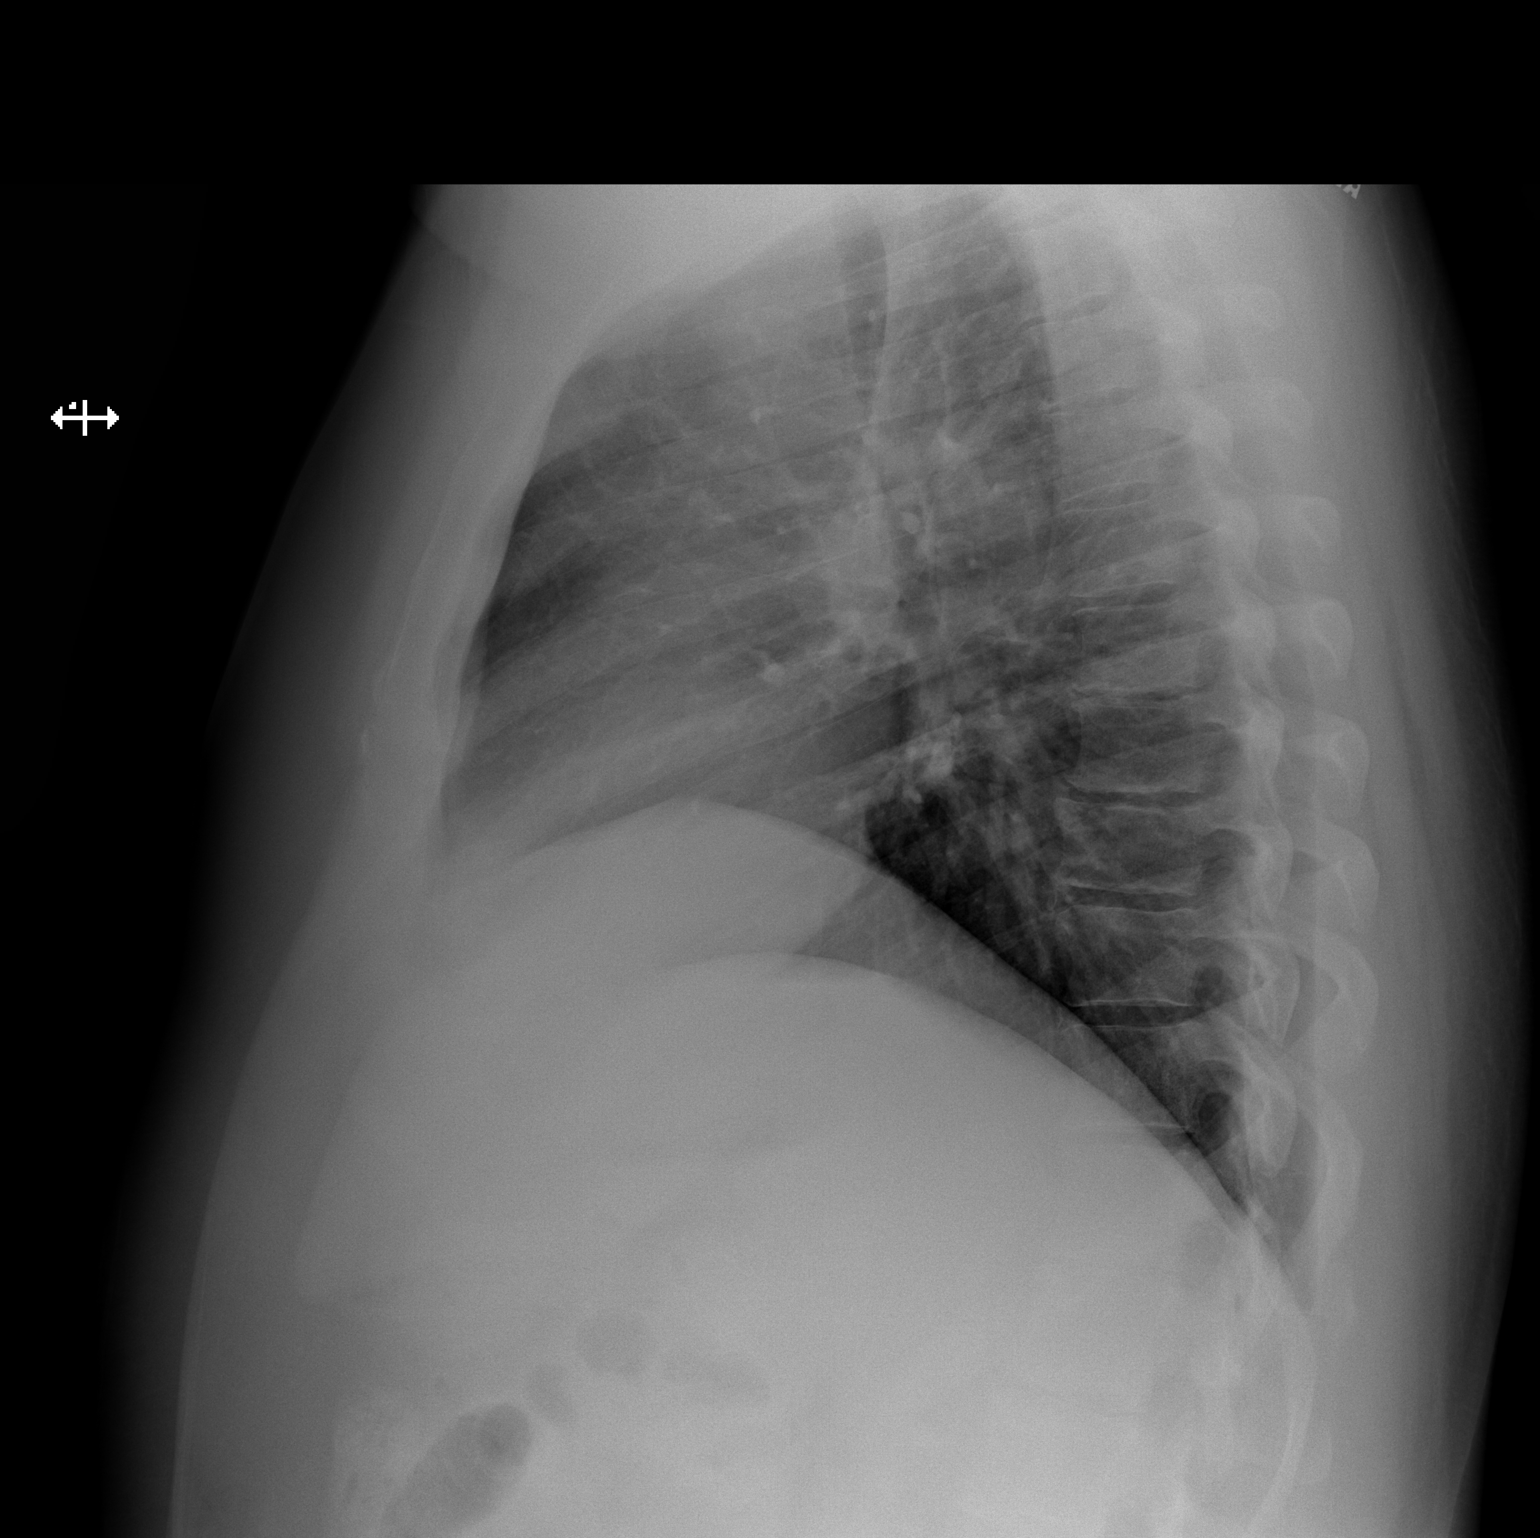

[2 of 2 positions shown; findings below may reference images not displayed]

IMPRESSION: No acute changes are identified.

## 2013-04-22 ENCOUNTER — Encounter: Payer: Self-pay | Admitting: Internal Medicine

## 2013-04-22 MED ORDER — GLIPIZIDE 5 MG PO TABS: 5.0000 mg | ORAL_TABLET | Freq: Two times a day (BID) | ORAL | Status: DC

## 2013-04-22 MED ORDER — LOSARTAN POTASSIUM 50 MG PO TABS: 50.0000 mg | ORAL_TABLET | Freq: Every day | ORAL | Status: DC

## 2013-04-22 MED ORDER — METFORMIN HCL 1000 MG PO TABS: 1000.0000 mg | ORAL_TABLET | Freq: Every day | ORAL | Status: DC

## 2013-04-25 ENCOUNTER — Encounter: Payer: Self-pay | Admitting: Internal Medicine

## 2013-05-14 MED ORDER — METFORMIN HCL 1000 MG PO TABS: 1000.0000 mg | ORAL_TABLET | Freq: Every day | ORAL | Status: DC

## 2013-05-14 NOTE — Telephone Encounter (Signed)
Please call [facility] tomorrow and confirm that they have received the metformin 1000 mg refill for Victor Butler.    Thanks.

## 2013-05-22 ENCOUNTER — Other Ambulatory Visit: Payer: Self-pay | Admitting: *Deleted

## 2013-05-23 MED ORDER — METFORMIN HCL 1000 MG PO TABS: 1000.0000 mg | ORAL_TABLET | Freq: Every day | ORAL | Status: DC

## 2013-06-02 ENCOUNTER — Encounter: Payer: Self-pay | Admitting: Internal Medicine

## 2013-06-04 ENCOUNTER — Encounter: Payer: Self-pay | Admitting: Internal Medicine

## 2013-06-05 ENCOUNTER — Encounter: Payer: Self-pay | Admitting: Internal Medicine

## 2013-06-07 ENCOUNTER — Encounter: Payer: Self-pay | Admitting: Internal Medicine

## 2013-06-12 ENCOUNTER — Encounter: Payer: Self-pay | Admitting: Internal Medicine

## 2013-06-13 ENCOUNTER — Encounter: Payer: Self-pay | Admitting: Internal Medicine

## 2013-07-18 ENCOUNTER — Other Ambulatory Visit: Payer: Self-pay | Admitting: *Deleted

## 2013-07-18 MED ORDER — SERTRALINE HCL 50 MG PO TABS: 50.0000 mg | ORAL_TABLET | Freq: Every day | ORAL | Status: DC

## 2013-07-19 ENCOUNTER — Encounter: Payer: Self-pay | Admitting: Internal Medicine

## 2013-08-08 ENCOUNTER — Other Ambulatory Visit: Payer: Self-pay

## 2013-09-23 ENCOUNTER — Encounter: Payer: Self-pay | Admitting: Internal Medicine

## 2013-09-23 DIAGNOSIS — R5381 Other malaise: Secondary | ICD-10-CM

## 2013-09-23 DIAGNOSIS — E669 Obesity, unspecified: Secondary | ICD-10-CM

## 2013-09-23 DIAGNOSIS — E119 Type 2 diabetes mellitus without complications: Secondary | ICD-10-CM

## 2013-10-09 ENCOUNTER — Ambulatory Visit: Payer: Self-pay | Admitting: Internal Medicine

## 2013-10-17 ENCOUNTER — Encounter: Payer: Self-pay | Admitting: Internal Medicine

## 2013-10-17 DIAGNOSIS — E1165 Type 2 diabetes mellitus with hyperglycemia: Principal | ICD-10-CM

## 2013-10-17 DIAGNOSIS — IMO0001 Reserved for inherently not codable concepts without codable children: Secondary | ICD-10-CM

## 2013-10-18 MED ORDER — GLUCOSE BLOOD VI STRP: ORAL_STRIP | Status: AC

## 2013-12-02 ENCOUNTER — Other Ambulatory Visit: Payer: Self-pay | Admitting: Internal Medicine

## 2013-12-03 NOTE — Telephone Encounter (Signed)
No recent Lipid panel on file I can see. Please advise Ok to fill.

## 2014-01-08 ENCOUNTER — Encounter: Payer: Self-pay | Admitting: Internal Medicine

## 2014-01-10 ENCOUNTER — Encounter: Payer: Self-pay | Admitting: Internal Medicine

## 2014-01-21 ENCOUNTER — Encounter: Payer: Self-pay | Admitting: Internal Medicine

## 2014-01-21 NOTE — Telephone Encounter (Signed)
Pt requesting lab order to have labs done at [facility] before he schedules a follow up office visit here. Please advise.

## 2014-01-24 LAB — LIPID PANEL
Cholesterol: 173 mg/dL (ref 0–200)
HDL: 27 mg/dL — AB (ref 35–70)
TRIGLYCERIDES: 587 mg/dL — AB (ref 40–160)

## 2014-01-24 LAB — BASIC METABOLIC PANEL
BUN: 12 mg/dL (ref 4–21)
CREATININE: 0.8 mg/dL (ref 0.6–1.3)
Glucose: 203 mg/dL
POTASSIUM: 4.7 mmol/L (ref 3.4–5.3)
Sodium: 124 mmol/L — AB (ref 137–147)

## 2014-01-24 LAB — HEPATIC FUNCTION PANEL
ALT: 37 U/L (ref 10–40)
AST: 32 U/L (ref 14–40)
Alkaline Phosphatase: 95 U/L (ref 25–125)

## 2014-01-24 LAB — HEMOGLOBIN A1C: Hgb A1c MFr Bld: 8.7 % — AB (ref 4.0–6.0)

## 2014-01-24 NOTE — Telephone Encounter (Signed)
Has this been handled?  I see three messages that did not get routed to me and patient is understandably frustrated.   Please print the letter in the queue if she still needs it.

## 2014-01-28 ENCOUNTER — Telehealth: Payer: Self-pay | Admitting: Internal Medicine

## 2014-02-05 ENCOUNTER — Encounter: Payer: Self-pay | Admitting: Internal Medicine

## 2014-02-05 ENCOUNTER — Other Ambulatory Visit: Payer: Self-pay | Admitting: Internal Medicine

## 2014-02-05 ENCOUNTER — Ambulatory Visit (INDEPENDENT_AMBULATORY_CARE_PROVIDER_SITE_OTHER): Payer: 59 | Admitting: Internal Medicine

## 2014-02-05 VITALS — BP 120/78 | HR 76 | Temp 98.5°F | Resp 16 | Wt 262.8 lb

## 2014-02-05 DIAGNOSIS — IMO0001 Reserved for inherently not codable concepts without codable children: Secondary | ICD-10-CM

## 2014-02-05 DIAGNOSIS — E785 Hyperlipidemia, unspecified: Secondary | ICD-10-CM

## 2014-02-05 DIAGNOSIS — E119 Type 2 diabetes mellitus without complications: Secondary | ICD-10-CM

## 2014-02-05 DIAGNOSIS — E1165 Type 2 diabetes mellitus with hyperglycemia: Secondary | ICD-10-CM

## 2014-02-05 MED ORDER — FENOFIBRATE 145 MG PO TABS: 145.0000 mg | ORAL_TABLET | Freq: Every day | ORAL | Status: DC

## 2014-02-05 MED ORDER — INSULIN NPH (HUMAN) (ISOPHANE) 100 UNIT/ML ~~LOC~~ SUSP: 10.0000 [IU] | Freq: Every day | SUBCUTANEOUS | Status: DC

## 2014-02-05 MED ORDER — SERTRALINE HCL 50 MG PO TABS: 50.0000 mg | ORAL_TABLET | Freq: Every day | ORAL | Status: DC

## 2014-02-05 MED ORDER — GLIPIZIDE 10 MG PO TABS: 10.0000 mg | ORAL_TABLET | Freq: Two times a day (BID) | ORAL | Status: DC

## 2014-02-05 NOTE — Patient Instructions (Addendum)
We are starting a low dose of NPH insulin to take at bedtime 10 units to start with  To lower your  high fasting blood sugars  Increase  The NPH insulin every 3 days by 3 units until  Your  fasting sugars are < 140 .     Continue metformin at your current dose but increase your glipizide to 10 mg twice daily .  Check your blood sugars twice daily as you are doing (fasting and 2 hrs post prandial)  E Mail me your readings in two week

## 2014-02-05 NOTE — Progress Notes (Signed)
Patient ID: Victor Butler, male   DOB: 03/17/1971, 43 y.o.   MRN: [id number]  Patient Active Problem List   Diagnosis Date Noted  . Cough 03/12/2013  . Sinus congestion 01/10/2013  . Paronychia of great toe, right 11/25/2012  . Need for immunization against typhoid 10/29/2012  . Dermatitis, contact 07/14/2012  . Anxiety, generalized 05/16/2012  . Type II or unspecified type diabetes mellitus without mention of complication, uncontrolled 10/12/2011  . Other and unspecified hyperlipidemia 06/19/2011  . Encounter for long-term (current) use of other medications 06/19/2011  . GERD (gastroesophageal reflux disease)   . Obesity (BMI 30-39.9)     Subjective:  CC:   Chief Complaint  Patient presents with  . Diabetes  . Follow-up    HPI:   Victor Butler is a 43 y.o. male who presents for Follow up on uncontrolled DM, hyperlipidemia, and obesity.  Has been lost to follow up 11 months a1c 8.7 .  He has not been checking his sugars or following a low carb diet.  Not exercising.,  Ran ou of fenofibrate several months ago. His fasting sugars have been 180 to 190.   Past Medical History  Diagnosis Date  . Diabetes mellitus   . GERD (gastroesophageal reflux disease)   . Hypercholesteremia   . Obesity (BMI 30-39.9)     No past surgical history on file.     The following portions of the patient's history were reviewed and updated as appropriate: Allergies, current medications, and problem list.    Review of Systems:   Patient denies headache, fevers, malaise, unintentional weight loss, skin rash, eye pain, sinus congestion and sinus pain, sore throat, dysphagia,  hemoptysis , cough, dyspnea, wheezing, chest pain, palpitations, orthopnea, edema, abdominal pain, nausea, melena, diarrhea, constipation, flank pain, dysuria, hematuria, urinary  Frequency, nocturia, numbness, tingling, seizures,  Focal weakness, Loss of consciousness,  Tremor, insomnia, depression, anxiety, and suicidal  ideation.     History   Social History  . Marital Status: Married    Spouse Name: N/A    Number of Children: N/A  . Years of Education: N/A   Occupational History  . Not on file.   Social History Main Topics  . Smoking status: Never Smoker   . Smokeless tobacco: Never Used  . Alcohol Use: No  . Drug Use: No  . Sexual Activity: Not on file   Other Topics Concern  . Not on file   Social History Narrative  . No narrative on file    Objective:  Filed Vitals:   02/05/14 1644  BP: 120/78  Pulse: 76  Temp: 98.5 F (36.9 C)  Resp: 16     General appearance: alert, cooperative and appears stated age Ears: normal TM's and external ear canals both ears Throat: lips, mucosa, and tongue normal; teeth and gums normal Neck: no adenopathy, no carotid bruit, supple, symmetrical, trachea midline and thyroid not enlarged, symmetric, no tenderness/mass/nodules Back: symmetric, no curvature. ROM normal. No CVA tenderness. Lungs: clear to auscultation bilaterally Heart: regular rate and rhythm, S1, S2 normal, no murmur, click, rub or gallop Abdomen: soft, non-tender; bowel sounds normal; no masses,  no organomegaly Pulses: 2+ and symmetric Skin: Skin color, texture, turgor normal. No rashes or lesions Lymph nodes: Cervical, supraclavicular, and axillary nodes normal.  Assessment and Plan:  Type II or unspecified type diabetes mellitus without mention of complication, uncontrolled Uncontrolled due to dietary and regimen noncompliance.  Long discussion today   About medications and compliance.  Starting  NPH at bedtime 10 units., advised to increasd by 3 units every 3 days . Continue metformin and glipizide.   Lab Results  Component Value Date   HGBA1C 8.7* 01/24/2014     Other and unspecified hyperlipidemia Resuming fenofibrate for trigs > 400.  Lab Results  Component Value Date   CHOL 173 01/24/2014   HDL 27* 01/24/2014   TRIG 587* 01/24/2014     A total of 25 minutes  of face to face time was spent with patient more than half of which was spent in counselling and coordination of care   Updated Medication List Outpatient Encounter Prescriptions as of 02/05/2014  Medication Sig  . ALPRAZolam (XANAX) 0.25 MG tablet Take 1 tablet (0.25 mg total) by mouth at bedtime as needed for sleep.  Marland Kitchen aspirin 81 MG EC tablet Take 81 mg by mouth daily.    . Cholecalciferol (VITAMIN D3) 400 UNITS CAPS Take 1,000 Units by mouth daily.   Marland Kitchen glipiZIDE (GLUCOTROL) 10 MG tablet Take 1 tablet (10 mg total) by mouth 2 (two) times daily before a meal.  . glucose blood (ACCU-CHEK INSTANT GLUCOSE TEST) test strip Use three times daily to check blood sugars .  Reason uncontrolled diabetes mellitus  . loratadine (CLARITIN) 10 MG tablet Take 10 mg by mouth daily.    Marland Kitchen losartan (COZAAR) 50 MG tablet Take 1 tablet (50 mg total) by mouth daily.  . metFORMIN (GLUCOPHAGE) 1000 MG tablet Take 1 tablet (1,000 mg total) by mouth daily with breakfast.  . omeprazole (PRILOSEC) 20 MG capsule Take 20 mg by mouth daily as needed.   . typhoid (VIVOTIF BERNA VACCINE) DR capsule Take 1 capsule by mouth every other day.  . [DISCONTINUED] glipiZIDE (GLUCOTROL) 5 MG tablet Take 1 tablet (5 mg total) by mouth 2 (two) times daily before a meal.  . fenofibrate (TRICOR) 145 MG tablet Take 1 tablet (145 mg total) by mouth daily.  . Lancets (ACCU-CHEK MULTICLIX) lancets Use as instructed  . sertraline (ZOLOFT) 50 MG tablet Take 1 tablet (50 mg total) by mouth daily.  . sildenafil (VIAGRA) 50 MG tablet Take 1 tablet (50 mg total) by mouth daily as needed for erectile dysfunction.  . [DISCONTINUED] amoxicillin-clavulanate (AUGMENTIN) 875-125 MG per tablet Take 1 tablet by mouth 2 (two) times daily.  . [DISCONTINUED] fenofibrate (TRICOR) 145 MG tablet Take 1 tablet (145 mg total) by mouth daily.  . [DISCONTINUED] sertraline (ZOLOFT) 50 MG tablet Take 1 tablet (50 mg total) by mouth daily.     Orders Placed This  Encounter  Procedures  . Basic metabolic panel  . Lipid panel  . Hepatic function panel  . Hemoglobin A1c    No Follow-up on file.

## 2014-02-05 NOTE — Progress Notes (Signed)
Pre-visit discussion using our clinic review tool. No additional management support is needed unless otherwise documented below in the visit note.  

## 2014-02-06 ENCOUNTER — Encounter: Payer: Self-pay | Admitting: Internal Medicine

## 2014-02-08 NOTE — Assessment & Plan Note (Addendum)
Resuming fenofibrate for trigs > 400.  Lab Results  Component Value Date   CHOL 173 01/24/2014   HDL 27* 01/24/2014   TRIG 587* 01/24/2014

## 2014-02-08 NOTE — Assessment & Plan Note (Addendum)
Uncontrolled due to dietary and regimen noncompliance.  Long discussion today   About medications and compliance.  Starting NPH at bedtime 10 units., advised to increasd by 3 units every 3 days . Continue metformin and glipizide.   Lab Results  Component Value Date   HGBA1C 8.7* 01/24/2014

## 2014-02-14 ENCOUNTER — Telehealth: Payer: Self-pay

## 2014-02-14 ENCOUNTER — Encounter: Payer: Self-pay | Admitting: Internal Medicine

## 2014-02-14 NOTE — Telephone Encounter (Signed)
Relevant patient education assigned to patient using Emmi. ° °

## 2014-03-03 ENCOUNTER — Encounter: Payer: Self-pay | Admitting: Internal Medicine

## 2014-03-18 ENCOUNTER — Other Ambulatory Visit: Payer: Self-pay | Admitting: Internal Medicine

## 2014-04-25 ENCOUNTER — Telehealth: Payer: Self-pay | Admitting: *Deleted

## 2014-04-25 DIAGNOSIS — IMO0001 Reserved for inherently not codable concepts without codable children: Secondary | ICD-10-CM

## 2014-04-25 DIAGNOSIS — E1165 Type 2 diabetes mellitus with hyperglycemia: Principal | ICD-10-CM

## 2014-04-25 NOTE — Telephone Encounter (Signed)
Chart reviewed for DM bundle. Last OV 02/05/14, no future appt scheduled. Also due for fasting labs. Mychart sent on need for appt after 05/07/14 and also fasting labs Labs ordered.

## 2014-05-14 ENCOUNTER — Other Ambulatory Visit: Payer: Self-pay | Admitting: Internal Medicine

## 2014-05-15 LAB — HGB A1C W/O EAG: Hgb A1c MFr Bld: 8 % — ABNORMAL HIGH (ref 4.8–5.6)

## 2014-05-15 LAB — MICROALBUMIN / CREATININE URINE RATIO
Creatinine, Ur: 120.5 mg/dL (ref 22.0–328.0)
MICROALB/CREAT RATIO: 12.4 mg/g creat (ref 0.0–30.0)
Microalbumin, Urine: 14.9 ug/mL (ref 0.0–17.0)

## 2014-05-17 ENCOUNTER — Telehealth: Payer: Self-pay | Admitting: Internal Medicine

## 2014-05-20 ENCOUNTER — Encounter: Payer: Self-pay | Admitting: Internal Medicine

## 2014-05-20 ENCOUNTER — Ambulatory Visit (INDEPENDENT_AMBULATORY_CARE_PROVIDER_SITE_OTHER): Payer: Self-pay | Admitting: Internal Medicine

## 2014-05-20 VITALS — BP 122/60 | HR 60 | Temp 98.4°F | Resp 16 | Ht 71.5 in | Wt 269.2 lb

## 2014-05-20 DIAGNOSIS — E1165 Type 2 diabetes mellitus with hyperglycemia: Principal | ICD-10-CM

## 2014-05-20 DIAGNOSIS — IMO0001 Reserved for inherently not codable concepts without codable children: Secondary | ICD-10-CM

## 2014-05-20 DIAGNOSIS — E785 Hyperlipidemia, unspecified: Secondary | ICD-10-CM

## 2014-05-20 DIAGNOSIS — E669 Obesity, unspecified: Secondary | ICD-10-CM

## 2014-05-20 LAB — HM DIABETES FOOT EXAM: HM Diabetic Foot Exam: NORMAL

## 2014-05-20 NOTE — Progress Notes (Signed)
Pre-visit discussion using our clinic review tool. No additional management support is needed unless otherwise documented below in the visit note.  

## 2014-05-20 NOTE — Patient Instructions (Signed)
Check a 3 am blood sugar ,  If it is > 140  Continue to increased bedtime insulin until BS are < 140 fasting. Take the NPH at 9:00  If the sugar at 3 am is < 80, decrease the evening insulin by 3 units   You can add 10 units of NPH in the am and continue titrating the evening dose as discussed   We will repeat your cholesterol in 3 months

## 2014-05-20 NOTE — Progress Notes (Signed)
Patient ID: Victor Butler, male   DOB: [birth year], 43 y.o.   MRN: [id number]    Patient Active Problem List   Diagnosis Date Noted  . Cough 03/12/2013  . Paronychia of great toe, right 11/25/2012  . Need for immunization against typhoid 10/29/2012  . Anxiety, generalized 05/16/2012  . Type II or unspecified type diabetes mellitus without mention of complication, uncontrolled 10/12/2011  . Other and unspecified hyperlipidemia 06/19/2011  . Encounter for long-term (current) use of other medications 06/19/2011  . GERD (gastroesophageal reflux disease)   . Obesity (BMI 30-39.9)     Subjective:  CC:   Chief Complaint  Patient presents with  . Follow-up  . Diabetes    HPI:   Victor Butler is a 43 y.o. male who presents for Dm follow up .    At last visit he had lost control of DM due to dietary and medication noncompliance. His last A1c was > 9 and he was advised to resume NPH insulin at bedtime. He has increased his dose 3 x 3 and is now taking 38 units .  His  fasting sugars remain above goal at 150 to 160 and he has not checked a 3 am BS to rule out rebound from hypoglycemia  Not exercising or following a careful diet due to work and home stressors.    Past Medical History  Diagnosis Date  . Diabetes mellitus   . GERD (gastroesophageal reflux disease)   . Hypercholesteremia   . Obesity (BMI 30-39.9)     History reviewed. No pertinent past surgical history.     The following portions of the patient's history were reviewed and updated as appropriate: Allergies, current medications, and problem list.    Review of Systems:   Patient denies headache, fevers, malaise, unintentional weight loss, skin rash, eye pain, sinus congestion and sinus pain, sore throat, dysphagia,  hemoptysis , cough, dyspnea, wheezing, chest pain, palpitations, orthopnea, edema, abdominal pain, nausea, melena, diarrhea, constipation, flank pain, dysuria, hematuria, urinary  Frequency, nocturia,  numbness, tingling, seizures,  Focal weakness, Loss of consciousness,  Tremor, insomnia, depression, anxiety, and suicidal ideation.     History   Social History  . Marital Status: Married    Spouse Name: N/A    Number of Children: N/A  . Years of Education: N/A   Occupational History  . Not on file.   Social History Main Topics  . Smoking status: Never Smoker   . Smokeless tobacco: Never Used  . Alcohol Use: No  . Drug Use: No  . Sexual Activity: Not on file   Other Topics Concern  . Not on file   Social History Narrative  . No narrative on file    Objective:  Filed Vitals:   05/20/14 1605  BP: 122/60  Pulse: 60  Temp: 98.4 F (36.9 C)  Resp: 16     General appearance: alert, cooperative and appears stated age Ears: normal TM's and external ear canals both ears Throat: lips, mucosa, and tongue normal; teeth and gums normal Neck: no adenopathy, no carotid bruit, supple, symmetrical, trachea midline and thyroid not enlarged, symmetric, no tenderness/mass/nodules Back: symmetric, no curvature. ROM normal. No CVA tenderness. Lungs: clear to auscultation bilaterally Heart: regular rate and rhythm, S1, S2 normal, no murmur, click, rub or gallop Abdomen: soft, non-tender; bowel sounds normal; no masses,  no organomegaly Pulses: 2+ and symmetric Skin: Skin color, texture, turgor normal. No rashes or lesions Lymph nodes: Cervical, supraclavicular, and axillary nodes normal. Foot  exam:  Nails are well trimmed,  No callouses,  Sensation intact to microfilament   Assessment and Plan:  Type II or unspecified type diabetes mellitus without mention of complication, uncontrolled Advised to check a 3 am BS before increasing his NPH dose any more.  Adding 10 units of NPH in the am. Taking losartan,  Has not provided a urine for protein testing despite multiple orders.   Lab Results  Component Value Date   HGBA1C 8.0* 05/14/2014   No results found for this basename:  MICROALBUR, [id number]     Obesity (BMI 30-39.9) I have addressed  BMI and recommended a low glycemic index diet utilizing smaller more frequent meals to increase metabolism.  I have also recommended that patient start exercising with a goal of 30 minutes of aerobic exercise a minimum of 5 days per week. Screening for lipid disorders, thyroid and diabetes to be done today.    Other and unspecified hyperlipidemia He reusmed tricor for trigs > 500 but did not get fasting lipdis repeated.  contineu tricro and repeat prior to next visit  Lab Results  Component Value Date   CHOL 173 01/24/2014   HDL 27* 01/24/2014   TRIG 587* 01/24/2014      Updated Medication List Outpatient Encounter Prescriptions as of 05/20/2014  Medication Sig  . ALPRAZolam (XANAX) 0.25 MG tablet Take 1 tablet (0.25 mg total) by mouth at bedtime as needed for sleep.  Marland Kitchen aspirin 81 MG EC tablet Take 81 mg by mouth daily.    . Cholecalciferol (VITAMIN D3) 400 UNITS CAPS Take 1,000 Units by mouth daily.   . fenofibrate (TRICOR) 145 MG tablet Take 1 tablet (145 mg total) by mouth daily.  Marland Kitchen glipiZIDE (GLUCOTROL) 10 MG tablet Take 1 tablet (10 mg total) by mouth 2 (two) times daily before a meal.  . glucose blood (ACCU-CHEK INSTANT GLUCOSE TEST) test strip Use three times daily to check blood sugars .  Reason uncontrolled diabetes mellitus  . insulin NPH Human (HUMULIN N,NOVOLIN N) 100 UNIT/ML injection Inject 0.1 mLs (10 Units total) into the skin at bedtime. Increase every 3 days by 3 units as needed  . loratadine (CLARITIN) 10 MG tablet Take 10 mg by mouth daily.    Marland Kitchen losartan (COZAAR) 50 MG tablet Take 1 tablet (50 mg total) by mouth daily.  . metFORMIN (GLUCOPHAGE) 1000 MG tablet Take 1 tablet (1,000 mg total) by mouth daily with breakfast.  . omeprazole (PRILOSEC) 20 MG capsule Take 20 mg by mouth daily as needed.   . sertraline (ZOLOFT) 50 MG tablet Take 1 tablet (50 mg total) by mouth daily.  . typhoid (VIVOTIF BERNA  VACCINE) DR capsule Take 1 capsule by mouth every other day.  . Lancets (ACCU-CHEK MULTICLIX) lancets Use as instructed  . sildenafil (VIAGRA) 50 MG tablet Take 1 tablet (50 mg total) by mouth daily as needed for erectile dysfunction.     Orders Placed This Encounter  Procedures  . HM DIABETES FOOT EXAM    Return in about 3 months (around 08/20/2014) for follow up diabetes.

## 2014-05-22 ENCOUNTER — Encounter: Payer: Self-pay | Admitting: Internal Medicine

## 2014-05-22 NOTE — Assessment & Plan Note (Signed)
He reusmed tricor for trigs > 500 but did not get fasting lipdis repeated.  contineu tricro and repeat prior to next visit  Lab Results  Component Value Date   CHOL 173 01/24/2014   HDL 27* 01/24/2014   TRIG 587* 01/24/2014

## 2014-05-22 NOTE — Assessment & Plan Note (Signed)
I have addressed  BMI and recommended a low glycemic index diet utilizing smaller more frequent meals to increase metabolism.  I have also recommended that patient start exercising with a goal of 30 minutes of aerobic exercise a minimum of 5 days per week. Screening for lipid disorders, thyroid and diabetes to be done today.   

## 2014-05-22 NOTE — Assessment & Plan Note (Signed)
Advised to check a 3 am BS before increasing his NPH dose any more.  Adding 10 units of NPH in the am. Taking losartan,  Has not provided a urine for protein testing despite multiple orders.   Lab Results  Component Value Date   HGBA1C 8.0* 05/14/2014   No results found for this basename: MICROALBUR, MALB24HUR

## 2014-05-23 LAB — LIPID PANEL
CHOLESTEROL: 149 mg/dL (ref 0–200)
HDL: 28 mg/dL — AB (ref 35–70)
LDL Cholesterol: 81 mg/dL
Triglycerides: 199 mg/dL — AB (ref 40–160)

## 2014-05-23 LAB — BASIC METABOLIC PANEL: Glucose: 141 mg/dL

## 2014-05-26 ENCOUNTER — Encounter: Payer: Self-pay | Admitting: Internal Medicine

## 2014-05-29 ENCOUNTER — Encounter: Payer: Self-pay | Admitting: Internal Medicine

## 2014-06-27 ENCOUNTER — Other Ambulatory Visit: Payer: Self-pay | Admitting: Internal Medicine

## 2014-07-09 ENCOUNTER — Encounter: Payer: Self-pay | Admitting: Internal Medicine

## 2014-07-10 ENCOUNTER — Telehealth: Payer: Self-pay | Admitting: Internal Medicine

## 2014-07-10 DIAGNOSIS — R0609 Other forms of dyspnea: Principal | ICD-10-CM

## 2014-07-10 NOTE — Telephone Encounter (Signed)
Patient will pick up lab slip and will get EKG on arrival Monday.

## 2014-07-10 NOTE — Telephone Encounter (Signed)
Is 8  am  15 min spot OK for Monday?

## 2014-07-10 NOTE — Telephone Encounter (Signed)
Yes that will work .  Please make a note to get an EKG first.  And since he is a [facility] employee,  If he can come by and pick up a [facility] sheet for labs and get them done tomorrow that would help,

## 2014-07-10 NOTE — Telephone Encounter (Signed)
Pt called to schedule appt for headache and diabetes issues. Pt was given next available appt in November. Pt stated that he and Dr. Derrel Nip had been emailing and she stated that she wanted to see him as soon as possible. Pt stated that looking online at [facility] the next appt is 10/12 at 8am. This is a 15 min appt please advise if this time slot is okay or where to add pt to the schedule.msn

## 2014-07-11 LAB — HEPATIC FUNCTION PANEL
ALT: 29 U/L (ref 10–40)
AST: 25 U/L (ref 14–40)
Alkaline Phosphatase: 75 U/L (ref 25–125)
BILIRUBIN, TOTAL: 0.4 mg/dL

## 2014-07-11 LAB — CBC AND DIFFERENTIAL
HCT: 40 % — AB (ref 41–53)
Hemoglobin: 13.7 g/dL (ref 13.5–17.5)
Neutrophils Absolute: 3 /uL
Platelets: 307 10*3/uL (ref 150–399)
WBC: 5.3 10^3/mL

## 2014-07-11 LAB — HEMOGLOBIN A1C: Hgb A1c MFr Bld: 7.3 % — AB (ref 4.0–6.0)

## 2014-07-11 LAB — LIPID PANEL
Cholesterol: 148 mg/dL (ref 0–200)
HDL: 24 mg/dL — AB (ref 35–70)
LDL Cholesterol: 68 mg/dL
TRIGLYCERIDES: 282 mg/dL — AB (ref 40–160)

## 2014-07-11 LAB — TSH: TSH: 4.65 u[IU]/mL (ref 0.41–5.90)

## 2014-07-11 LAB — BASIC METABOLIC PANEL
BUN: 14 mg/dL (ref 4–21)
Creatinine: 1 mg/dL (ref 0.6–1.3)
GLUCOSE: 174 mg/dL
POTASSIUM: 4.3 mmol/L (ref 3.4–5.3)
SODIUM: 139 mmol/L (ref 137–147)

## 2014-07-14 ENCOUNTER — Encounter: Payer: Self-pay | Admitting: Internal Medicine

## 2014-07-14 ENCOUNTER — Ambulatory Visit (INDEPENDENT_AMBULATORY_CARE_PROVIDER_SITE_OTHER): Payer: 59 | Admitting: Internal Medicine

## 2014-07-14 VITALS — BP 118/84 | HR 74 | Temp 98.3°F | Resp 16 | Ht 71.5 in | Wt 266.5 lb

## 2014-07-14 DIAGNOSIS — IMO0002 Reserved for concepts with insufficient information to code with codable children: Secondary | ICD-10-CM

## 2014-07-14 DIAGNOSIS — Z79899 Other long term (current) drug therapy: Secondary | ICD-10-CM

## 2014-07-14 DIAGNOSIS — R0609 Other forms of dyspnea: Secondary | ICD-10-CM

## 2014-07-14 DIAGNOSIS — R519 Headache, unspecified: Secondary | ICD-10-CM | POA: Insufficient documentation

## 2014-07-14 DIAGNOSIS — J321 Chronic frontal sinusitis: Secondary | ICD-10-CM

## 2014-07-14 DIAGNOSIS — R51 Headache: Secondary | ICD-10-CM

## 2014-07-14 DIAGNOSIS — R002 Palpitations: Secondary | ICD-10-CM

## 2014-07-14 DIAGNOSIS — E1165 Type 2 diabetes mellitus with hyperglycemia: Secondary | ICD-10-CM

## 2014-07-14 MED ORDER — METFORMIN HCL ER (OSM) 1000 MG PO TB24: 1000.0000 mg | ORAL_TABLET | Freq: Every day | ORAL | Status: DC

## 2014-07-14 NOTE — Assessment & Plan Note (Signed)
Referring to ENT to rule ut chronic sinusitis as cause,  If CT is normal and headaches return,  MrI brain advised given his new headache pattern

## 2014-07-14 NOTE — Assessment & Plan Note (Signed)
Advised to huse glipize XL or take short actinb bid,  Reduce NPH to 15 units until effect of gllipizide is seen Taking losartan,  uine was negative for  protein in August    Lab Results  Component Value Date   HGBA1C 8.0* 05/14/2014

## 2014-07-14 NOTE — Assessment & Plan Note (Addendum)
Patient taking NPH insulin at bedtime and statin for hyperlipidemia,  LFTS and  a1c due ,  No lows.

## 2014-07-14 NOTE — Assessment & Plan Note (Signed)
Likely due to obesity and deconditioning,  But need to consider early CAD given uncontrolled DM.  Discussed referral for stress testing, will review labs first when avaoilable.

## 2014-07-14 NOTE — Progress Notes (Signed)
Patient ID: Victor Butler, male   DOB: May 06, 1971, 43 y.o.   MRN: [id number]  Patient Active Problem List   Diagnosis Date Noted  . Recurrent headache 07/14/2014  . Long-term use of high-risk medication 07/14/2014  . Exertional dyspnea 07/10/2014  . Paronychia of great toe, right 11/25/2012  . Need for immunization against typhoid 10/29/2012  . Anxiety, generalized 05/16/2012  . DM (diabetes mellitus), type 2, uncontrolled 10/12/2011  . Other and unspecified hyperlipidemia 06/19/2011  . Encounter for long-term (current) use of other medications 06/19/2011  . GERD (gastroesophageal reflux disease)   . Obesity (BMI 30-39.9)     Subjective:  CC:   Chief Complaint  Patient presents with  . Acute Visit    Chest pain, patient thinks it is anxiety    HPI:   Victor Butler is a 43 y.o. male who presents for  multiple issues including follow up on DM,  Obesity and hypertension.  Recent episode of loss of peripheral vision right eye,  Occurred last Thrursday,  Lasted about 30 minutes , followed by a frontal headache.  He did not go to ER. bc sister talked him out it of despite. Neurologist warnings.Has an eye appt on 10/26   Headaches have improved but on the day he sent an urgent e mail it was severe, frontal in location  , others were back of head bilaterally neck,  Has chronic sinus congestion and drainage,  Despite trial of saline irrigation and use of claritin,  Has not seen ENT i n several years. Thought he had sinusitis so he went to  [facility] Thursday, was not given abx,  Was told to go to ER   Headaches have now resolved.  oesn't wake up with one,  Usually by 10 am.  .  No prior history of migraines. Chronic sinusitis ,  Only uses claritin  Only.  Under a lot of stress at work,  Not exercising except occasional walk on a treadmill.  No chest pain or exertional dyspnea on the treadmill.  But after one flight of stairs he is very short of breath.  His  BMI 39   Feels hot  all all time ,  Breaking out in a sweat at rest. Had labs on Friday .  No low blood sugars,   Forgetting to take glip and metform more than once daily Usng NPH at bedtime  24 units  ,  Has not titrrated dose up lately despite having fastings in the 150 to 200 range.    High protein breakfast,  Eats bread at lunch but not at dinner,,  Having exertional dyspnea severe, with one flight of stairs. Has gained weight   PGF has CAD but not parents        Past Medical History  Diagnosis Date  . Diabetes mellitus   . GERD (gastroesophageal reflux disease)   . Hypercholesteremia   . Obesity (BMI 30-39.9)     No past surgical history on file.     The following portions of the patient's history were reviewed and updated as appropriate: Allergies, current medications, and problem list.    Review of Systems:   Patient denies headache, fevers, malaise, unintentional weight loss, skin rash, eye pain, sinus congestion and sinus pain, sore throat, dysphagia,  hemoptysis , cough, dyspnea, wheezing, chest pain, palpitations, orthopnea, edema, abdominal pain, nausea, melena, diarrhea, constipation, flank pain, dysuria, hematuria, urinary  Frequency, nocturia, numbness, tingling, seizures,  Focal weakness, Loss of consciousness,  Tremor, insomnia,  depression, anxiety, and suicidal ideation.     History   Social History  . Marital Status: Married    Spouse Name: N/A    Number of Children: N/A  . Years of Education: N/A   Occupational History  . Not on file.   Social History Main Topics  . Smoking status: Never Smoker   . Smokeless tobacco: Never Used  . Alcohol Use: No  . Drug Use: No  . Sexual Activity: Not on file   Other Topics Concern  . Not on file   Social History Narrative  . No narrative on file    Objective:  Filed Vitals:   07/14/14 0803  BP: 118/84  Pulse: 74  Temp: 98.3 F (36.8 C)  Resp: 16     General appearance: alert, cooperative and appears stated  age Ears: normal TM's and external ear canals both ears Throat: lips, mucosa, and tongue normal; teeth and gums normal Neck: no adenopathy, no carotid bruit, supple, symmetrical, trachea midline and thyroid not enlarged, symmetric, no tenderness/mass/nodules Back: symmetric, no curvature. ROM normal. No CVA tenderness. Lungs: clear to auscultation bilaterally Heart: regular rate and rhythm, S1, S2 normal, no murmur, click, rub or gallop Abdomen: soft, non-tender; bowel sounds normal; no masses,  no organomegaly Pulses: 2+ and symmetric Skin: Skin color, texture, turgor normal. No rashes or lesions Lymph nodes: Cervical, supraclavicular, and axillary nodes normal.  Assessment and Plan:  Recurrent headache Referring to ENT to rule ut chronic sinusitis as cause,  If CT is normal and headaches return,  MrI brain advised given his new headache pattern   Exertional dyspnea Likely due to obesity and deconditioning,  But need to consider early CAD given uncontrolled DM.  Discussed referral for stress testing, will review labs first when avaoilable.   Long-term use of high-risk medication Patient taking NPH insulin at bedtime and statin for hyperlipidemia,  LFTS and  a1c due ,  No lows.   DM (diabetes mellitus), type 2, uncontrolled Advised to huse glipize XL or take short actinb bid,  Reduce NPH to 15 units until effect of gllipizide is seen Taking losartan,  uine was negative for  protein in August    Lab Results  Component Value Date   HGBA1C 8.0* 05/14/2014   A total of 40 minutes was spent with patient more than half of which was spent in counseling patient on the above mentioned issues , reviewing and explaining recent labs and imaging studies done, and coordination of care.   Updated Medication List Outpatient Encounter Prescriptions as of 07/14/2014  Medication Sig  . ALPRAZolam (XANAX) 0.25 MG tablet Take 1 tablet (0.25 mg total) by mouth at bedtime as needed for sleep.  Marland Kitchen aspirin  81 MG EC tablet Take 81 mg by mouth daily.    . Cholecalciferol (VITAMIN D3) 400 UNITS CAPS Take 1,000 Units by mouth daily.   . fenofibrate (TRICOR) 145 MG tablet Take 1 tablet by mouth  daily  . glucose blood (ACCU-CHEK INSTANT GLUCOSE TEST) test strip Use three times daily to check blood sugars .  Reason uncontrolled diabetes mellitus  . insulin NPH Human (HUMULIN N,NOVOLIN N) 100 UNIT/ML injection Inject 0.1 mLs (10 Units total) into the skin at bedtime. Increase every 3 days by 3 units as needed  . loratadine (CLARITIN) 10 MG tablet Take 10 mg by mouth daily.    Marland Kitchen losartan (COZAAR) 50 MG tablet Take 1 tablet (50 mg total) by mouth daily.  Marland Kitchen omeprazole (PRILOSEC) 20 MG  capsule Take 20 mg by mouth daily as needed.   . sertraline (ZOLOFT) 50 MG tablet Take 25 mg by mouth daily.  . typhoid (VIVOTIF BERNA VACCINE) DR capsule Take 1 capsule by mouth every other day.  . [DISCONTINUED] glipiZIDE (GLUCOTROL) 10 MG tablet Take 1 tablet (10 mg total) by mouth 2 (two) times daily before a meal.  . [DISCONTINUED] lisinopril (PRINIVIL,ZESTRIL) 5 MG tablet Take one tablet by mouth  daily.  . [DISCONTINUED] metFORMIN (GLUCOPHAGE) 1000 MG tablet Take 1 tablet by mouth  daily  . [DISCONTINUED] sertraline (ZOLOFT) 50 MG tablet Take 1 tablet (50 mg total) by mouth daily.  . Lancets (ACCU-CHEK MULTICLIX) lancets Use as instructed  . metformin (FORTAMET) 1000 MG (OSM) 24 hr tablet Take 1 tablet (1,000 mg total) by mouth daily with breakfast.  . [DISCONTINUED] sildenafil (VIAGRA) 50 MG tablet Take 1 tablet (50 mg total) by mouth daily as needed for erectile dysfunction.     Orders Placed This Encounter  Procedures  . Ambulatory referral to ENT  . EKG 12-Lead    No Follow-up on file.

## 2014-07-14 NOTE — Patient Instructions (Signed)
Resume glipzide and metformin Twice daily  Reduce    the NPH initially to 15 units for the first week  And gradually increase using the 3 x 3 rule until fasting glucose is 100 to 130

## 2014-07-14 NOTE — Progress Notes (Signed)
Pre-visit discussion using our clinic review tool. No additional management support is needed unless otherwise documented below in the visit note.  

## 2014-07-15 ENCOUNTER — Encounter: Payer: Self-pay | Admitting: Internal Medicine

## 2014-07-17 ENCOUNTER — Telehealth: Payer: Self-pay | Admitting: Internal Medicine

## 2014-07-21 ENCOUNTER — Encounter: Payer: Self-pay | Admitting: Internal Medicine

## 2014-07-21 MED ORDER — GLIPIZIDE ER 10 MG PO TB24: 10.0000 mg | ORAL_TABLET | Freq: Every day | ORAL | Status: DC

## 2014-07-28 LAB — HM DIABETES EYE EXAM

## 2014-07-29 ENCOUNTER — Encounter: Payer: Self-pay | Admitting: Internal Medicine

## 2014-07-31 ENCOUNTER — Other Ambulatory Visit: Payer: Self-pay

## 2014-07-31 ENCOUNTER — Encounter: Payer: Self-pay | Admitting: Internal Medicine

## 2014-07-31 ENCOUNTER — Telehealth: Payer: Self-pay | Admitting: Internal Medicine

## 2014-07-31 NOTE — Telephone Encounter (Signed)
Victor Butler  My reply to Victor Butler may requure adding him to the schedule tomorrow:  I do not prescribe antibiotics without a face to face evaluation .  Supplanting office visit is not  the intent of MyChart, and it never will be.  If you think you have more than a viral infection, I will work you in tomorrow for an acute visit.   Regards,  Dr. Derrel Nip   ===View-only below this line===   ----- Message -----    From: Victor Butler    Sent: 07/31/2014  2:01 PM EDT      To: Deborra Medina, MD Subject: Non-Urgent Medical Question  I need to know if I need to make adjustments or be given some additional medications right now.  The ENT gave me an antibiotic (SMZ/TMP DS TAB 800-160) for a staph infection in my ears/nose.  I finished that medication on Tuesday.  During the time I was taking that I spent 3 days in the bed with a 101 fever that I got from my wife's cold/sinus infection.  I have now developed the cough and sore throat that she got after her fever.  My concern is that my fasting sugars have been between 211 and 239 this week so far.  I continue to feel hot (not registering a fever at the moment).  My wife thinks I need another antibiotic to address this bug now that the staph infection is gone.  I have upped my glipizide to 96m per day and plan to switch to the XR tablets next week for the glipizide and Metformin now that they have both arrived.

## 2014-09-18 ENCOUNTER — Encounter: Payer: Self-pay | Admitting: *Deleted

## 2015-01-17 ENCOUNTER — Telehealth: Payer: Self-pay | Admitting: Internal Medicine

## 2015-01-19 ENCOUNTER — Telehealth: Payer: Self-pay | Admitting: Internal Medicine

## 2015-01-19 NOTE — Telephone Encounter (Signed)
CMET fasting lipids,  Hgba1c,  Urine microalb/cr ratio

## 2015-01-19 NOTE — Telephone Encounter (Signed)
Pt was notified of need for appt, scheduled 02/02/15. He needs lab req for [facility]. Please sign and check or tell me what labs you need. In red folder.

## 2015-01-19 NOTE — Telephone Encounter (Signed)
See other encounter.

## 2015-01-19 NOTE — Telephone Encounter (Signed)
e11.65

## 2015-01-19 NOTE — Telephone Encounter (Signed)
Needing an order for [employer] . He left his order in the car that is in the body shop.

## 2015-01-24 LAB — BASIC METABOLIC PANEL
BUN: 13 mg/dL (ref 4–21)
Creatinine: 1.1 mg/dL (ref 0.6–1.3)
GLUCOSE: 182 mg/dL
POTASSIUM: 4.7 mmol/L (ref 3.4–5.3)
SODIUM: 140 mmol/L (ref 137–147)

## 2015-01-24 LAB — LIPID PANEL
CHOLESTEROL: 141 mg/dL (ref 0–200)
HDL: 27 mg/dL — AB (ref 35–70)
LDL Cholesterol: 64 mg/dL
TRIGLYCERIDES: 249 mg/dL — AB (ref 40–160)

## 2015-01-24 LAB — HEPATIC FUNCTION PANEL
ALT: 31 U/L (ref 10–40)
AST: 27 U/L (ref 14–40)
Alkaline Phosphatase: 76 U/L (ref 25–125)
Bilirubin, Total: 0.3 mg/dL

## 2015-01-24 LAB — HEMOGLOBIN A1C: Hgb A1c MFr Bld: 7.2 % — AB (ref 4.0–6.0)

## 2015-01-26 ENCOUNTER — Encounter: Payer: Self-pay | Admitting: Internal Medicine

## 2015-02-02 ENCOUNTER — Ambulatory Visit: Payer: Self-pay | Admitting: Internal Medicine

## 2015-02-12 ENCOUNTER — Encounter: Payer: Self-pay | Admitting: Internal Medicine

## 2015-02-20 ENCOUNTER — Encounter: Payer: Self-pay | Admitting: Internal Medicine

## 2015-02-20 ENCOUNTER — Ambulatory Visit (INDEPENDENT_AMBULATORY_CARE_PROVIDER_SITE_OTHER): Payer: 59 | Admitting: Internal Medicine

## 2015-02-20 VITALS — BP 124/80 | HR 52 | Temp 97.6°F | Resp 14 | Ht 72.0 in | Wt 265.5 lb

## 2015-02-20 DIAGNOSIS — E1165 Type 2 diabetes mellitus with hyperglycemia: Secondary | ICD-10-CM | POA: Diagnosis not present

## 2015-02-20 DIAGNOSIS — IMO0002 Reserved for concepts with insufficient information to code with codable children: Secondary | ICD-10-CM

## 2015-02-20 DIAGNOSIS — F411 Generalized anxiety disorder: Secondary | ICD-10-CM

## 2015-02-20 DIAGNOSIS — E669 Obesity, unspecified: Secondary | ICD-10-CM

## 2015-02-20 DIAGNOSIS — Z79899 Other long term (current) drug therapy: Secondary | ICD-10-CM

## 2015-02-20 DIAGNOSIS — E781 Pure hyperglyceridemia: Secondary | ICD-10-CM

## 2015-02-20 DIAGNOSIS — R232 Flushing: Secondary | ICD-10-CM | POA: Diagnosis not present

## 2015-02-20 MED ORDER — GLUCOSE BLOOD VI STRP: ORAL_STRIP | Status: DC

## 2015-02-20 MED ORDER — FENOFIBRATE 145 MG PO TABS: ORAL_TABLET | ORAL | Status: DC

## 2015-02-20 MED ORDER — SERTRALINE HCL 50 MG PO TABS: 25.0000 mg | ORAL_TABLET | Freq: Every day | ORAL | Status: AC

## 2015-02-20 MED ORDER — METFORMIN HCL ER (OSM) 1000 MG PO TB24: ORAL_TABLET | ORAL | Status: AC

## 2015-02-20 MED ORDER — GLIPIZIDE ER 10 MG PO TB24: ORAL_TABLET | ORAL | Status: AC

## 2015-02-20 NOTE — Progress Notes (Signed)
Subjective

## 2015-02-20 NOTE — Progress Notes (Signed)
Subjective:  Patient ID: Victor Butler, male    DOB: 04/01/1971  Age: 44 y.o. MRN: [id number]  CC: The primary encounter diagnosis was Flushing reaction. Diagnoses of DM (diabetes mellitus), type 2, uncontrolled, Long-term use of high-risk medication, Anxiety, generalized, Hypertriglyceridemia, and Obesity (BMI 30-39.9) were also pertinent to this visit.  HPI Victor Butler presents for follow up on uncontrolled diabetes, obesity and hypertension/ His last OV was  October 2015.  He has gained weight since then.  One low blood sugars caused by skipping meal and continuining glipizide.  Started an organic diet in early March , so stopped taking NPH 40 units at bedtime and fastings were 160 at the time. Dropped 18 lbs on the diet in 3 weeks. But stopped the diet and has  gained back 10 lb since going off of it .  He does not recall whether the diet improved his glycemic control.   Now eating a modified low carb diet . Eating 4 meals daily and snacks are high protein snacks,  But still eating 1-2 fast food restaurant biscuits for breakfast half of the time and a green apple/spinach/coconut milk smoothie the other days when he has time to prepare it.    Still taking glipizide xl but has added 10 mg of short acting glipizide at bedtime and has been experiencing malaise accompanied by hot flashes.  Labs done recently and reviewed  Outpatient Prescriptions Prior to Visit  Medication Sig Dispense Refill  . ALPRAZolam (XANAX) 0.25 MG tablet Take 1 tablet (0.25 mg total) by mouth at bedtime as needed for sleep. 30 tablet 2  . aspirin 81 MG EC tablet Take 81 mg by mouth daily.      . Cholecalciferol (VITAMIN D3) 400 UNITS CAPS Take 1,000 Units by mouth daily.     Marland Kitchen loratadine (CLARITIN) 10 MG tablet Take 10 mg by mouth daily.      Marland Kitchen losartan (COZAAR) 50 MG tablet Take 1 tablet (50 mg total) by mouth daily. 90 tablet 2  . omeprazole (PRILOSEC) 20 MG capsule Take 20 mg by mouth daily as needed.     .  typhoid (VIVOTIF BERNA VACCINE) DR capsule Take 1 capsule by mouth every other day. 4 capsule 0  . fenofibrate (TRICOR) 145 MG tablet Take 1 tablet by mouth  daily 90 tablet 2  . GLIPIZIDE XL 10 MG 24 hr tablet Take 1 tablet by mouth  daily with breakfast 90 tablet 0  . metformin (FORTAMET) 1000 MG (OSM) 24 hr tablet Take 1 tablet by mouth  daily with breakfast 90 tablet 0  . Lancets (ACCU-CHEK MULTICLIX) lancets Use as instructed 100 each 12  . insulin NPH Human (HUMULIN N,NOVOLIN N) 100 UNIT/ML injection Inject 0.1 mLs (10 Units total) into the skin at bedtime. Increase every 3 days by 3 units as needed (Patient not taking: Reported on 02/20/2015) 10 mL 11  . sertraline (ZOLOFT) 50 MG tablet Take 25 mg by mouth daily.     No facility-administered medications prior to visit.    Review of Systems;  Patient denies headache, fevers,  unintentional weight loss, skin rash, eye pain, sinus congestion and sinus pain, sore throat, dysphagia,  hemoptysis , cough, dyspnea, wheezing, chest pain, palpitations, orthopnea, edema, abdominal pain, nausea, melena, diarrhea, constipation, flank pain, dysuria, hematuria, urinary  Frequency, nocturia, numbness, tingling, seizures,  Focal weakness, Loss of consciousness,  Tremor, insomnia, depression, anxiety, and suicidal ideation.     Objective:  BP 124/80 mmHg  Pulse  52  Temp(Src) 97.6 F (36.4 C) (Oral)  Resp 14  Ht 6' (1.829 m)  Wt 265 lb 8 oz (120.43 kg)  BMI 36.00 kg/m2  SpO2 98%  BP Readings from Last 3 Encounters:  02/20/15 124/80  07/14/14 118/84  05/20/14 122/60    Wt Readings from Last 3 Encounters:  02/20/15 265 lb 8 oz (120.43 kg)  07/14/14 266 lb 8 oz (120.884 kg)  05/20/14 269 lb 4 oz (122.131 kg)    General appearance: alert, cooperative and appears stated age Ears: normal TM's and external ear canals both ears Throat: lips, mucosa, and tongue normal; teeth and gums normal Neck: no adenopathy, no carotid bruit, supple,  symmetrical, trachea midline and thyroid not enlarged, symmetric, no tenderness/mass/nodules Back: symmetric, no curvature. ROM normal. No CVA tenderness. Lungs: clear to auscultation bilaterally Heart: regular rate and rhythm, S1, S2 normal, no murmur, click, rub or gallop Abdomen: soft, non-tender; bowel sounds normal; no masses,  no organomegaly Pulses: 2+ and symmetric Skin: Skin color, texture, turgor normal. No rashes or lesions Lymph nodes: Cervical, supraclavicular, and axillary nodes normal. .  Assessment & Plan:   Problem List Items Addressed This Visit    Obesity (BMI 30-39.9)    I have addressed  BMI and recommended a low glycemic index diet utilizing smaller more frequent meals to increase metabolism.  I have also recommended that patient start exercising with a goal of 30 minutes of aerobic exercise a minimum of 5 days per week. Goal weight is 214 lbs for BMI < 30       Relevant Medications   glipiZIDE (GLIPIZIDE XL) 10 MG 24 hr tablet   metformin (FORTAMET) 1000 MG (OSM) 24 hr tablet   Hypertriglyceridemia    imporved with fenofibrate.  No changes today   Lab Results  Component Value Date   CHOL 141 01/24/2015   HDL 27* 01/24/2015   LDLCALC 64 01/24/2015   TRIG 249* 01/24/2015         Relevant Medications   fenofibrate (TRICOR) 145 MG tablet   DM (diabetes mellitus), type 2, uncontrolled    improved by follow up A1c.  Reducing dose of glipizide due to side effects.  continue 10 mg XL.  Diet addressed and lack of regular participation in aerobic exercise recommended given BMI and patient's preference  to avoid adding more medication       Relevant Medications   glipiZIDE (GLIPIZIDE XL) 10 MG 24 hr tablet   metformin (FORTAMET) 1000 MG (OSM) 24 hr tablet   Anxiety, generalized    Managed with reduced dose of zoloft and prn xanax, Continue 25 mg daily of zoloft.  Work stressors discussed,  Encouraged to make his own health a priority and add exercise        Long-term use of high-risk medication       Liver enzymes are normal , no changes today.  Lab Results  Component Value Date   ALT 31 01/24/2015   AST 27 01/24/2015   ALKPHOS 76 01/24/2015   BILITOT 0.4 05/08/2012   Lab Results  Component Value Date   CREATININE 1.1 01/24/2015   Lab Results  Component Value Date   NA 140 01/24/2015   K 4.7 01/24/2015   CL 102 05/08/2012   CO2 21 05/08/2012         Flushing reaction - Primary    Diaphoresis is reported as an adverse effecgt in 3% of patients who take glipizide.  Will reduce dose to Glipizide XL  20 mg daily and check testosterone and thyroid levels.      Relevant Medications   fenofibrate (TRICOR) 145 MG tablet      I have discontinued Mr. Collington's insulin NPH Human. I have changed his GLIPIZIDE XL to glipiZIDE. I have also changed his sertraline. Additionally, I am having him start on glucose blood. Lastly, I am having him maintain his loratadine, Vitamin D3, aspirin, omeprazole, accu-chek multiclix, ALPRAZolam, typhoid, losartan, fenofibrate, and metformin.  Meds ordered this encounter  Medications  . sertraline (ZOLOFT) 50 MG tablet    Sig: Take 0.5 tablets (25 mg total) by mouth daily.    Dispense:  45 tablet    Refill:  2  . fenofibrate (TRICOR) 145 MG tablet    Sig: Take 1 tablet by mouth  daily    Dispense:  90 tablet    Refill:  2  . glipiZIDE (GLIPIZIDE XL) 10 MG 24 hr tablet    Sig: Take 1 tablet by mouth  daily with breakfast    Dispense:  90 tablet    Refill:  2  . metformin (FORTAMET) 1000 MG (OSM) 24 hr tablet    Sig: Take 1 tablet by mouth  daily with breakfast    Dispense:  90 tablet    Refill:  2  . glucose blood test strip    Sig: Use to test diabetes once daily    Dispense:  100 each    Refill:  3    Medications Discontinued During This Encounter  Medication Reason  . insulin NPH Human (HUMULIN N,NOVOLIN N) 100 UNIT/ML injection   . sertraline (ZOLOFT) 50 MG tablet Reorder  . fenofibrate  (TRICOR) 145 MG tablet Reorder  . GLIPIZIDE XL 10 MG 24 hr tablet Reorder  . metformin (FORTAMET) 1000 MG (OSM) 24 hr tablet Reorder    Follow-up: Return in about 3 months (around 05/23/2015) for follow up diabetes.   Crecencio Mc, MD

## 2015-02-20 NOTE — Patient Instructions (Addendum)
Try the Premier Protein chococolate shake   30 g protein 2 carbs 160 cal   Try to avoid biscuits   Stop the extra glipzide at bedtime,  Continue the glipizide XL and the metformin   Try the  mini sweet peppers, try pork rinds!!!  both are very low carb  Fold its,  Joseph's low carb pita breads and lavash  Bread  ([employer])   Follow up  In August ,  Labs prior   testing for thyroid and testosterone  For flushing reaction   MAKE EXERCISE A PRIORITY

## 2015-02-20 NOTE — Progress Notes (Signed)
Pre-visit discussion using our clinic review tool. No additional management support is needed unless otherwise documented below in the visit note.  

## 2015-02-22 NOTE — Assessment & Plan Note (Signed)
I have addressed  BMI and recommended a low glycemic index diet utilizing smaller more frequent meals to increase metabolism.  I have also recommended that patient start exercising with a goal of 30 minutes of aerobic exercise a minimum of 5 days per week. Goal weight is 214 lbs for BMI < 30

## 2015-02-22 NOTE — Assessment & Plan Note (Signed)
improved by follow up A1c.  Reducing dose of glipizide due to side effects.  continue 10 mg XL.  Diet addressed and lack of regular participation in aerobic exercise recommended given BMI and patient's preference  to avoid adding more medication

## 2015-02-22 NOTE — Assessment & Plan Note (Signed)
Diaphoresis is reported as an adverse effecgt in 3% of patients who take glipizide.  Will reduce dose to Glipizide XL 20 mg daily and check testosterone and thyroid levels.

## 2015-02-22 NOTE — Assessment & Plan Note (Signed)
imporved with fenofibrate.  No changes today   Lab Results  Component Value Date   CHOL 141 01/24/2015   HDL 27* 01/24/2015   LDLCALC 64 01/24/2015   TRIG 249* 01/24/2015

## 2015-02-22 NOTE — Assessment & Plan Note (Signed)
Liver enzymes are normal , no changes today.  Lab Results  Component Value Date   ALT 31 01/24/2015   AST 27 01/24/2015   ALKPHOS 76 01/24/2015   BILITOT 0.4 05/08/2012   Lab Results  Component Value Date   CREATININE 1.1 01/24/2015   Lab Results  Component Value Date   NA 140 01/24/2015   K 4.7 01/24/2015   CL 102 05/08/2012   CO2 21 05/08/2012

## 2015-02-22 NOTE — Assessment & Plan Note (Addendum)
Managed with reduced dose of zoloft and prn xanax, Continue 25 mg daily of zoloft.  Work stressors discussed,  Encouraged to make his own health a priority and add exercise

## 2015-03-03 ENCOUNTER — Other Ambulatory Visit: Payer: Self-pay | Admitting: *Deleted

## 2015-03-03 ENCOUNTER — Encounter: Payer: Self-pay | Admitting: Internal Medicine

## 2015-03-03 MED ORDER — GLUCOSE BLOOD VI STRP: ORAL_STRIP | Status: AC

## 2015-03-31 ENCOUNTER — Other Ambulatory Visit: Payer: Self-pay | Admitting: Internal Medicine

## 2015-04-07 ENCOUNTER — Encounter: Payer: Self-pay | Admitting: Internal Medicine

## 2015-04-07 NOTE — Telephone Encounter (Signed)
Last visit 02/20/15, see mychart, requesting Alprazolam refill

## 2015-04-08 MED ORDER — ALPRAZOLAM 0.25 MG PO TABS: 0.2500 mg | ORAL_TABLET | Freq: Every evening | ORAL | Status: AC | PRN

## 2015-04-08 NOTE — Telephone Encounter (Signed)
Faxed to pharmacy

## 2015-06-05 ENCOUNTER — Encounter: Payer: Self-pay | Admitting: Internal Medicine

## 2015-06-08 NOTE — Telephone Encounter (Signed)
Encounter closed

## 2015-12-09 ENCOUNTER — Other Ambulatory Visit: Payer: Self-pay | Admitting: Internal Medicine

## 2015-12-09 NOTE — Telephone Encounter (Signed)
Patient moved to [place], are you still refilling her prescriptions?  Please advise?

## 2015-12-10 NOTE — Telephone Encounter (Signed)
NO

## 2016-02-09 ENCOUNTER — Other Ambulatory Visit: Payer: Self-pay | Admitting: Internal Medicine

## 2016-03-17 ENCOUNTER — Other Ambulatory Visit: Payer: Self-pay | Admitting: Internal Medicine

## 2016-03-17 NOTE — Telephone Encounter (Signed)
pLEASE LET PATIENT KNOW THAT Refills  HAVE BEEN denied because he has not been seen in a year and has moved away, last I heard.

## 2016-03-17 NOTE — Telephone Encounter (Signed)
Please advise to refill no OV since 02/20/15

## 2016-03-18 NOTE — Telephone Encounter (Signed)
My chart message sent due to no answer by phone.

## 2016-10-12 ENCOUNTER — Other Ambulatory Visit: Payer: Self-pay | Admitting: Internal Medicine

## 2016-10-12 NOTE — Telephone Encounter (Signed)
PT past OV was 02/20/15 and last labs were 01/24/15. Pt has not scheduled future appts. Last refill for accu-chek test strips was 03/03/15. Ok to refill?

## 2017-06-28 NOTE — Telephone Encounter (Signed)
Error

## 2017-06-30 NOTE — Telephone Encounter (Signed)
Error
